# Patient Record
Sex: Female | Born: 1975
Health system: Southern US, Community
[De-identification: ages and names within clinical notes are randomized; demographics above are authoritative.]

## PROBLEM LIST (undated history)

## (undated) HISTORY — PX: CHOLECYSTECTOMY: SHX55

---

## 1998-02-16 ENCOUNTER — Ambulatory Visit (HOSPITAL_COMMUNITY): Admission: RE | Admit: 1998-02-16 | Discharge: 1998-02-16 | Payer: Self-pay | Admitting: Family Medicine

## 2004-04-28 ENCOUNTER — Emergency Department (HOSPITAL_COMMUNITY): Admission: EM | Admit: 2004-04-28 | Discharge: 2004-04-28 | Payer: Self-pay | Admitting: Emergency Medicine

## 2004-05-18 ENCOUNTER — Emergency Department (HOSPITAL_COMMUNITY): Admission: EM | Admit: 2004-05-18 | Discharge: 2004-05-18 | Payer: Self-pay | Admitting: Emergency Medicine

## 2004-09-29 ENCOUNTER — Emergency Department (HOSPITAL_COMMUNITY): Admission: EM | Admit: 2004-09-29 | Discharge: 2004-09-30 | Payer: Self-pay | Admitting: Emergency Medicine

## 2005-03-14 ENCOUNTER — Emergency Department (HOSPITAL_COMMUNITY): Admission: EM | Admit: 2005-03-14 | Discharge: 2005-03-14 | Payer: Self-pay | Admitting: Emergency Medicine

## 2006-03-07 ENCOUNTER — Emergency Department (HOSPITAL_COMMUNITY): Admission: EM | Admit: 2006-03-07 | Discharge: 2006-03-07 | Payer: Self-pay | Admitting: Emergency Medicine

## 2006-06-22 ENCOUNTER — Emergency Department (HOSPITAL_COMMUNITY): Admission: EM | Admit: 2006-06-22 | Discharge: 2006-06-22 | Payer: Self-pay | Admitting: Emergency Medicine

## 2006-07-17 ENCOUNTER — Emergency Department (HOSPITAL_COMMUNITY): Admission: EM | Admit: 2006-07-17 | Discharge: 2006-07-17 | Payer: Self-pay | Admitting: Emergency Medicine

## 2006-08-01 ENCOUNTER — Emergency Department (HOSPITAL_COMMUNITY): Admission: EM | Admit: 2006-08-01 | Discharge: 2006-08-01 | Payer: Self-pay | Admitting: Emergency Medicine

## 2006-08-09 ENCOUNTER — Encounter (INDEPENDENT_AMBULATORY_CARE_PROVIDER_SITE_OTHER): Payer: Self-pay | Admitting: Specialist

## 2006-08-09 ENCOUNTER — Ambulatory Visit (HOSPITAL_COMMUNITY): Admission: RE | Admit: 2006-08-09 | Discharge: 2006-08-09 | Payer: Self-pay | Admitting: Surgery

## 2006-08-19 ENCOUNTER — Emergency Department (HOSPITAL_COMMUNITY): Admission: EM | Admit: 2006-08-19 | Discharge: 2006-08-19 | Payer: Self-pay | Admitting: Emergency Medicine

## 2006-09-07 ENCOUNTER — Emergency Department (HOSPITAL_COMMUNITY): Admission: EM | Admit: 2006-09-07 | Discharge: 2006-09-07 | Payer: Self-pay | Admitting: Emergency Medicine

## 2006-09-16 ENCOUNTER — Emergency Department (HOSPITAL_COMMUNITY): Admission: EM | Admit: 2006-09-16 | Discharge: 2006-09-16 | Payer: Self-pay | Admitting: Emergency Medicine

## 2006-10-17 ENCOUNTER — Emergency Department (HOSPITAL_COMMUNITY): Admission: EM | Admit: 2006-10-17 | Discharge: 2006-10-17 | Payer: Self-pay | Admitting: Emergency Medicine

## 2006-11-01 ENCOUNTER — Emergency Department: Payer: Self-pay | Admitting: Emergency Medicine

## 2007-02-04 ENCOUNTER — Emergency Department (HOSPITAL_COMMUNITY): Admission: EM | Admit: 2007-02-04 | Discharge: 2007-02-04 | Payer: Self-pay | Admitting: Emergency Medicine

## 2008-04-01 ENCOUNTER — Emergency Department (HOSPITAL_BASED_OUTPATIENT_CLINIC_OR_DEPARTMENT_OTHER): Admission: EM | Admit: 2008-04-01 | Discharge: 2008-04-01 | Payer: Self-pay | Admitting: Emergency Medicine

## 2008-04-24 ENCOUNTER — Emergency Department (HOSPITAL_BASED_OUTPATIENT_CLINIC_OR_DEPARTMENT_OTHER): Admission: EM | Admit: 2008-04-24 | Discharge: 2008-04-24 | Payer: Self-pay | Admitting: Emergency Medicine

## 2008-10-19 ENCOUNTER — Emergency Department (HOSPITAL_BASED_OUTPATIENT_CLINIC_OR_DEPARTMENT_OTHER): Admission: EM | Admit: 2008-10-19 | Discharge: 2008-10-19 | Payer: Self-pay | Admitting: Emergency Medicine

## 2008-10-19 ENCOUNTER — Ambulatory Visit: Payer: Self-pay | Admitting: Diagnostic Radiology

## 2008-11-07 ENCOUNTER — Emergency Department (HOSPITAL_BASED_OUTPATIENT_CLINIC_OR_DEPARTMENT_OTHER): Admission: EM | Admit: 2008-11-07 | Discharge: 2008-11-07 | Payer: Self-pay | Admitting: Emergency Medicine

## 2008-12-06 ENCOUNTER — Emergency Department (HOSPITAL_BASED_OUTPATIENT_CLINIC_OR_DEPARTMENT_OTHER): Admission: EM | Admit: 2008-12-06 | Discharge: 2008-12-06 | Payer: Self-pay | Admitting: Emergency Medicine

## 2008-12-28 ENCOUNTER — Emergency Department (HOSPITAL_BASED_OUTPATIENT_CLINIC_OR_DEPARTMENT_OTHER): Admission: EM | Admit: 2008-12-28 | Discharge: 2008-12-28 | Payer: Self-pay | Admitting: Emergency Medicine

## 2009-07-27 ENCOUNTER — Emergency Department (HOSPITAL_BASED_OUTPATIENT_CLINIC_OR_DEPARTMENT_OTHER): Admission: EM | Admit: 2009-07-27 | Discharge: 2009-07-27 | Payer: Self-pay | Admitting: Emergency Medicine

## 2009-07-27 ENCOUNTER — Ambulatory Visit: Payer: Self-pay | Admitting: Diagnostic Radiology

## 2010-01-18 ENCOUNTER — Ambulatory Visit: Payer: Self-pay | Admitting: Diagnostic Radiology

## 2010-01-18 ENCOUNTER — Emergency Department (HOSPITAL_BASED_OUTPATIENT_CLINIC_OR_DEPARTMENT_OTHER): Admission: EM | Admit: 2010-01-18 | Discharge: 2010-01-18 | Payer: Self-pay | Admitting: Emergency Medicine

## 2010-05-03 ENCOUNTER — Emergency Department (HOSPITAL_COMMUNITY): Admission: EM | Admit: 2010-05-03 | Discharge: 2010-05-03 | Payer: Self-pay | Admitting: Emergency Medicine

## 2010-07-09 ENCOUNTER — Ambulatory Visit: Payer: Self-pay | Admitting: Diagnostic Radiology

## 2010-07-09 ENCOUNTER — Emergency Department (HOSPITAL_BASED_OUTPATIENT_CLINIC_OR_DEPARTMENT_OTHER): Admission: EM | Admit: 2010-07-09 | Discharge: 2010-07-09 | Payer: Self-pay | Admitting: Emergency Medicine

## 2011-01-21 NOTE — Op Note (Signed)
[name], [name]               ACCOUNT NO.:  [id number]   MEDICAL RECORD NO.:  [id number]          PATIENT TYPE:  AMB   LOCATION:  SDS                          FACILITY:  [facility]   PHYSICIAN:  Thomas A. Cornett, M.D.DATE OF BIRTH:  24-Jun-1976   DATE OF PROCEDURE:  08/09/2006  DATE OF DISCHARGE:  08/09/2006                               OPERATIVE REPORT   PREOPERATIVE DIAGNOSIS:  Symptomatic cholelithiasis.   POSTOPERATIVE DIAGNOSIS:  Symptomatic cholelithiasis.   PROCEDURE:  Laparoscopic cholecystectomy with interoperative  cholangiogram.   SURGEON:  Thomas A. Cornett, M.D.   ASSISTANT:  Ollen Gross. Vernell Morgans, M.D.   ANESTHESIA:  General endotracheal anesthesia with 20 mL of 0.25%  Sensorcaine plain.   ESTIMATED BLOOD LOSS:  20 mL.   SPECIMEN:  Gallbladder with gallstones to pathology.   INDICATIONS FOR PROCEDURE:  The patient is a 35 year old female who has  developed right upper quadrant pain, nausea, and vomiting.  She was seen  a week ago in the emergency room and was found to have cholelithiasis  and her symptoms fit that of symptomatic cholelithiasis.  I saw her in  the office and recommended laparoscopic cholecystectomy to her for  symptomatic cholelithiasis.  She voiced her understanding and wished to  proceed.   DESCRIPTION OF PROCEDURE:  The patient was brought to the operating room  and placed supine.  After the induction of general anesthesia, the  abdomen is prepped and draped in a sterile fashion.  A 1 cm  supraumbilical incision was made and dissection was carried down to the  midline fascia.  She was very obese and we had some difficulty getting  through her midline but we did.  The preperitoneal space was entered.  We had a hard time getting through it because of the length and the  thickness of her abdominal wall.  I was able to use my finger and push  through the peritoneal lining into the abdominal cavity without any  obstruction.  A pursestring suture  of 0 Vicryl was placed around the  fascial opening and a 12-mm Hassan cannula was placed under direct  vision.  Pneumoperitoneum was then created to 15 mmHg of CO2 and the  laparoscope was placed.  There is no evidence of any injury to her  bowel.  Next, a subxiphoid port was placed using an 11 mm port.  Two 5  mm ports were placed in the a right mid abdomen, both under direct  vision.   The gallbladder is identified and grasped by its dome and pushed toward  the patient's right shoulder.  A second grasper was used and the  infundibulum was grasped and pulled toward the patient's right lower  quadrant.  I used the cautery to score the peritoneal lining over the  gallbladder.  I was able then to dissect out the cystic duct as the only  tubular structure entering the gallbladder.  The cystic artery was lying  over the top of the cystic duct and I dissected it away from the cystic  duct and went ahead and got it clipped.  I then put  a clip on the  gallbladder side of the cystic duct.  A small incision was made and  using the scissors in the cystic duct.  Cholangiogram was then  performed.  I tried to use a Cook cholangiogram catheter but the duct  was quite small and I could not use it.  I then used a Reddick catheter  and placed this in the cystic duct and was able to control it with the  clip.  Interoperative cholangiogram was performed using 1/2 strength  Hypaque.  The cystic duct could be visualized as well as the common bile  duct with free flow of contrast into the duodenum without obstruction,  stone, or stricture.  There is free flow of contrast up the common  hepatic duct into the bifurcation of the right and left hepatic ducts  without evidence of extravasation, stricture, stone or obstruction.   Once the cholangiogram was complete, the catheter was removed. The  cystic duct was triply clipped and then divided.  The cystic artery was  divided at this point since it had already  been clipped.  There was a  posterior branch of the cystic artery that I controlled with a clip and  divided it.  Cautery was used dissect the gallbladder from the  gallbladder fossa with excellent hemostasis.  An EndoCatch bag was  placed and the gallbladder was placed in the EndoCatch bag.  The  gallbladder bed was inspected and found to be hemostatic with no leakage  of bile or any evidence of bleeding.  The clips were on the cystic duct  and cystic artery respectively.  At this point, a small amount of  irrigation was used and suctioned out.  The gallbladder was then removed  through the umbilical port site and passed off the field.  The umbilical  port site was closed the 0 Vicryl under direct vision.  At this point,  the CO2 was allowed to escape after visualizing the 5-mm ports being  removed under direct vision.  Once these were removed and the CO2 was  allowed to escape, the skin incisions were all closed with 4-0 Monocryl.  Steri-Strips and dry dressings were applied.  All final counts of  sponge, needle and instruments were found to be correct at this portion  of the case.  The patient was awakened and taken to recovery in  satisfactory condition.      Thomas A. Cornett, M.D.  Electronically Signed     TAC/MEDQ  D:  08/09/2006  T:  08/10/2006  Job:  [id number]

## 2011-03-12 ENCOUNTER — Encounter: Payer: Self-pay | Admitting: *Deleted

## 2011-03-12 ENCOUNTER — Emergency Department (HOSPITAL_BASED_OUTPATIENT_CLINIC_OR_DEPARTMENT_OTHER)
Admission: EM | Admit: 2011-03-12 | Discharge: 2011-03-12 | Disposition: A | Discharge status: Home / Self Care | Payer: Self-pay | Attending: Emergency Medicine | Admitting: Emergency Medicine

## 2011-03-12 DIAGNOSIS — R51 Headache: Secondary | ICD-10-CM | POA: Insufficient documentation

## 2011-03-12 DIAGNOSIS — J320 Chronic maxillary sinusitis: Secondary | ICD-10-CM | POA: Insufficient documentation

## 2011-03-12 DIAGNOSIS — J3489 Other specified disorders of nose and nasal sinuses: Secondary | ICD-10-CM | POA: Insufficient documentation

## 2011-03-12 MED ORDER — PSEUDOEPHEDRINE HCL 60 MG PO TABS: 60.0000 mg | ORAL_TABLET | Freq: Four times a day (QID) | ORAL | Status: AC | PRN

## 2011-03-12 MED ORDER — AMOXICILLIN 500 MG PO CAPS
ORAL_CAPSULE | ORAL | Status: AC
  Filled 2011-03-12: qty 2

## 2011-03-12 MED ORDER — PSEUDOEPHEDRINE HCL 60 MG PO TABS: 60.0000 mg | ORAL_TABLET | Freq: Four times a day (QID) | ORAL | Status: DC | PRN

## 2011-03-12 MED ORDER — HYDROCODONE-ACETAMINOPHEN 5-325 MG PO TABS
2.0000 | ORAL_TABLET | Freq: Four times a day (QID) | ORAL | Status: AC | PRN
  Administered 2011-03-12: 2 via ORAL
  Filled 2011-03-12: qty 2

## 2011-03-12 MED ORDER — AMOXICILLIN 500 MG PO CAPS: 1000.0000 mg | ORAL_CAPSULE | Freq: Three times a day (TID) | ORAL | Status: AC

## 2011-03-12 MED ORDER — AMOXICILLIN 500 MG PO CAPS
1000.0000 mg | ORAL_CAPSULE | Freq: Three times a day (TID) | ORAL | Status: AC
  Administered 2011-03-12: 1000 mg via ORAL

## 2011-03-12 MED ORDER — HYDROCODONE-ACETAMINOPHEN 5-325 MG PO TABS: 1.0000 | ORAL_TABLET | Freq: Four times a day (QID) | ORAL | Status: AC | PRN

## 2011-03-12 NOTE — ED Provider Notes (Signed)
History     Chief Complaint  Patient presents with  . Nasal Congestion  . Headache   Patient is a 34 y.o. female presenting with sinusitis. The history is provided by the patient.  Sinusitis  This is a recurrent problem. Episode onset: for 3-4 weeks. The problem has been gradually worsening. There has been no fever. The pain is at a severity of 10/10. The pain is severe. The pain has been intermittent since onset. Associated symptoms include congestion, ear pain and sinus pressure. Pertinent negatives include no chills, no sweats, no hoarse voice, no sore throat, no swollen glands, no cough and no shortness of breath. She has tried acetaminophen and other medications (afrin spray for the last 3 days) for the symptoms. The treatment provided no relief.  Pt reports pain and congestion in frontal and maxillary area.  History reviewed. No pertinent past medical history.  Past Surgical History  Procedure Date  . Cholecystectomy     History reviewed. No pertinent family history.  History  Substance Use Topics  . Smoking status: Never Smoker   . Smokeless tobacco: Not on file  . Alcohol Use: No    OB History    Grav Para Term Preterm Abortions TAB SAB Ect Mult Living                  Review of Systems  Constitutional: Negative for fever, chills, diaphoresis, appetite change and fatigue.  HENT: Positive for ear pain, congestion and sinus pressure. Negative for hearing loss, nosebleeds, sore throat, hoarse voice, facial swelling, rhinorrhea, neck pain, neck stiffness and ear discharge.   Eyes: Negative.   Respiratory: Negative.  Negative for cough and shortness of breath.   Cardiovascular: Negative.   Gastrointestinal: Negative.   Genitourinary: Negative.   Musculoskeletal: Negative.   Neurological: Negative.   Hematological: Negative.   Psychiatric/Behavioral: Negative.     Physical Exam  BP 122/95  Pulse 68  Temp(Src) 98.6 F (37 C) (Oral)  Resp 18  Ht 5\' 1"  (1.549 m)   SpO2 100%  LMP 03/06/2011  Physical Exam  Constitutional: She is oriented to person, place, and time. She appears well-developed and well-nourished.       uncomfortable  HENT:  Head: Normocephalic and atraumatic.       Fulness in both tim without redness or effusion Tenderness to percussion over maxillary sinuses frontal sinuses   Eyes: Conjunctivae and EOM are normal. Pupils are equal, round, and reactive to light.  Neck: Normal range of motion. Neck supple. No tracheal deviation present. No thyromegaly present.  Cardiovascular: Normal rate, regular rhythm, normal heart sounds and intact distal pulses.   Pulmonary/Chest: Effort normal and breath sounds normal. No stridor. She has no wheezes. She has no rales. She exhibits no tenderness.  Abdominal: Soft. Bowel sounds are normal. She exhibits no distension and no mass. There is no tenderness. There is no rebound and no guarding.  Neurological: She is alert and oriented to person, place, and time.  Skin: Skin is warm and dry.  Psychiatric: She has a normal mood and affect. Her behavior is normal. Judgment and thought content normal.    ED Course  Procedures  MDM Pt with 3-4 weeks of congestion, pain.  Concern for chronic sinusitis.  Will treat pain with vicodin, and treat congestion with sudafed/amoxcillin.  [facility], instructed to f/u with them in about a week.      Olivia Mackie, MD 03/12/11 2124

## 2011-03-12 NOTE — ED Notes (Signed)
Pt with sinus pressure and nasal congestion x 3 weeks reports HA intromittently throughout that time HA became worse this am  HA is relieved somewhat with OTC meds

## 2011-04-10 ENCOUNTER — Encounter (HOSPITAL_BASED_OUTPATIENT_CLINIC_OR_DEPARTMENT_OTHER): Payer: Self-pay | Admitting: *Deleted

## 2011-04-10 ENCOUNTER — Emergency Department (HOSPITAL_BASED_OUTPATIENT_CLINIC_OR_DEPARTMENT_OTHER)
Admission: EM | Admit: 2011-04-10 | Discharge: 2011-04-10 | Disposition: A | Discharge status: Home / Self Care | Payer: Self-pay | Attending: Emergency Medicine | Admitting: Emergency Medicine

## 2011-04-10 ENCOUNTER — Emergency Department (INDEPENDENT_AMBULATORY_CARE_PROVIDER_SITE_OTHER): Payer: Self-pay

## 2011-04-10 DIAGNOSIS — X58XXXA Exposure to other specified factors, initial encounter: Secondary | ICD-10-CM

## 2011-04-10 DIAGNOSIS — S6990XA Unspecified injury of unspecified wrist, hand and finger(s), initial encounter: Secondary | ICD-10-CM

## 2011-04-10 DIAGNOSIS — S6980XA Other specified injuries of unspecified wrist, hand and finger(s), initial encounter: Secondary | ICD-10-CM

## 2011-04-10 DIAGNOSIS — S63659A Sprain of metacarpophalangeal joint of unspecified finger, initial encounter: Secondary | ICD-10-CM | POA: Insufficient documentation

## 2011-04-10 MED ORDER — OXYCODONE-ACETAMINOPHEN 5-325 MG PO TABS: 2.0000 | ORAL_TABLET | ORAL | Status: AC | PRN

## 2011-04-10 MED ORDER — OXYCODONE-ACETAMINOPHEN 5-325 MG PO TABS
ORAL_TABLET | ORAL | Status: AC
  Filled 2011-04-10: qty 1

## 2011-04-10 MED ORDER — OXYCODONE-ACETAMINOPHEN 5-325 MG PO TABS
1.0000 | ORAL_TABLET | Freq: Once | ORAL | Status: AC
  Administered 2011-04-10: 1 via ORAL

## 2011-04-10 MED ORDER — OXYCODONE-ACETAMINOPHEN 5-325 MG PO TABS
1.0000 | ORAL_TABLET | Freq: Once | ORAL | Status: AC
  Administered 2011-04-10: 1 via ORAL
  Filled 2011-04-10: qty 1

## 2011-04-10 NOTE — ED Notes (Signed)
Pt tripped this Pm and landed on her right hand pt with pain to 1st sand 2ed digits pt with pain and swelling

## 2011-04-10 NOTE — ED Provider Notes (Signed)
History     CSN: [id number] Arrival date & time: 04/10/2011  7:45 PM  Chief Complaint  Patient presents with  . Finger Injury   Patient is a 35 y.o. female presenting with hand pain.  Hand Pain This is a new problem. The current episode started less than 1 hour ago. The problem occurs constantly. The problem has not changed since onset. patient states that she fell and hurt her index and middle fingers on her left hand. Pain and swelling. No numbness, but limited movement due to pain. No other injuries.   History reviewed. No pertinent past medical history.  Past Surgical History  Procedure Date  . Cholecystectomy     History reviewed. No pertinent family history.  History  Substance Use Topics  . Smoking status: Never Smoker   . Smokeless tobacco: Not on file  . Alcohol Use: No    OB History    Grav Para Term Preterm Abortions TAB SAB Ect Mult Living                  Review of Systems  HENT: Negative for neck pain.   Musculoskeletal: Positive for joint swelling.  Neurological: Negative for weakness and numbness.    Physical Exam  BP 126/86  Pulse 97  Temp(Src) 97.5 F (36.4 C) (Oral)  Resp 18  SpO2 98%  LMP 03/06/2011  Physical Exam  Vitals reviewed. Constitutional: She appears well-developed.  HENT:  Head: Normocephalic.  Eyes: Pupils are equal, round, and reactive to light.  Neck: Normal range of motion.  Musculoskeletal:       Swelling and tenderness over proximal phalynx of right index and middle fingers. Some erethema. Skin intact. Decreased ROM due to pain.   Neurological: She is alert.  Skin: Skin is warm and dry.    ED Course  Procedures  No results found for this or any previous visit. Dg Finger Index Right  04/10/2011  *RADIOLOGY REPORT*  Clinical Data: Injury to right index and middle fingers  RIGHT INDEX FINGER 2+V  Comparison: None.  Findings: No fracture or dislocation is seen.  The joint spaces are preserved.  Possible mild soft tissue  swelling proximally.  IMPRESSION: No fracture or dislocation is seen.  Original Report Authenticated By: Charline Bills, M.D.   Dg Finger Middle Right  04/10/2011  *RADIOLOGY REPORT*  Clinical Data: Injury to right index and middle fingers.  RIGHT MIDDLE FINGER 2+V  Comparison: None.  Findings: No fracture or dislocation is seen.  The joint spaces are preserved.  Mild soft tissue swelling along the proximal phalanx.  IMPRESSION: No fracture or dislocation is seen.  Mild soft tissue swelling.  Original Report Authenticated By: Charline Bills, M.D.     MDM Finger injury xray negative. Buddy tap and follow up as needed.       Juliet Rude. Rubin Payor, MD 04/10/11 2228

## 2011-04-17 ENCOUNTER — Emergency Department (HOSPITAL_BASED_OUTPATIENT_CLINIC_OR_DEPARTMENT_OTHER): Payer: Self-pay

## 2011-04-17 ENCOUNTER — Emergency Department (INDEPENDENT_AMBULATORY_CARE_PROVIDER_SITE_OTHER): Payer: Self-pay

## 2011-04-17 ENCOUNTER — Emergency Department (HOSPITAL_BASED_OUTPATIENT_CLINIC_OR_DEPARTMENT_OTHER)
Admission: EM | Admit: 2011-04-17 | Discharge: 2011-04-18 | Disposition: A | Discharge status: Home / Self Care | Payer: Self-pay | Attending: Emergency Medicine | Admitting: Emergency Medicine

## 2011-04-17 ENCOUNTER — Encounter (HOSPITAL_BASED_OUTPATIENT_CLINIC_OR_DEPARTMENT_OTHER): Payer: Self-pay | Admitting: *Deleted

## 2011-04-17 DIAGNOSIS — M7730 Calcaneal spur, unspecified foot: Secondary | ICD-10-CM

## 2011-04-17 DIAGNOSIS — S9030XA Contusion of unspecified foot, initial encounter: Secondary | ICD-10-CM

## 2011-04-17 DIAGNOSIS — X500XXA Overexertion from strenuous movement or load, initial encounter: Secondary | ICD-10-CM | POA: Insufficient documentation

## 2011-04-17 DIAGNOSIS — M25579 Pain in unspecified ankle and joints of unspecified foot: Secondary | ICD-10-CM | POA: Insufficient documentation

## 2011-04-17 DIAGNOSIS — Y92009 Unspecified place in unspecified non-institutional (private) residence as the place of occurrence of the external cause: Secondary | ICD-10-CM | POA: Insufficient documentation

## 2011-04-17 DIAGNOSIS — M7989 Other specified soft tissue disorders: Secondary | ICD-10-CM

## 2011-04-17 MED ORDER — HYDROCODONE-ACETAMINOPHEN 5-325 MG PO TABS
2.0000 | ORAL_TABLET | Freq: Once | ORAL | Status: AC
  Administered 2011-04-17: 2 via ORAL
  Filled 2011-04-17: qty 2

## 2011-04-17 MED ORDER — HYDROCODONE-ACETAMINOPHEN 5-325 MG PO TABS
ORAL_TABLET | ORAL | Status: AC
  Filled 2011-04-17: qty 1

## 2011-04-17 NOTE — ED Provider Notes (Signed)
History     CSN: [id number] Arrival date & time: 04/17/2011 10:31 PM  Chief Complaint  Patient presents with  . Foot Injury   Patient is a 35 y.o. female presenting with foot injury. The history is provided by the patient.  Foot Injury  The incident occurred 3 to 5 hours ago. The incident occurred at home. The injury mechanism was a fall. The pain is present in the right foot. The quality of the pain is described as aching, sharp and throbbing. The pain is at a severity of 5/10. The pain is moderate. The pain has been constant since onset. Pertinent negatives include no numbness. She reports no foreign bodies present. The symptoms are aggravated by bearing weight and palpation. She has tried ice for the symptoms. The treatment provided no relief.   patient reports she stepped in a hole and injured right foot patient complains of swelling to the mid upper right foot there is bruising.  History reviewed. No pertinent past medical history.  Past Surgical History  Procedure Date  . Cholecystectomy     History reviewed. No pertinent family history.  History  Substance Use Topics  . Smoking status: Never Smoker   . Smokeless tobacco: Not on file  . Alcohol Use: No    OB History    Grav Para Term Preterm Abortions TAB SAB Ect Mult Living                  Review of Systems  Musculoskeletal: Positive for myalgias and joint swelling.  Neurological: Negative for numbness.  All other systems reviewed and are negative.    Physical Exam  BP 132/80  Pulse 80  Temp(Src) 98.2 F (36.8 C) (Oral)  Resp 20  Ht 5' 1.5" (1.562 m)  Wt 235 lb (106.595 kg)  BMI 43.68 kg/m2  SpO2 96%  LMP 04/06/2011  Physical Exam  Nursing note and vitals reviewed. Constitutional: She appears well-developed and well-nourished.  Eyes: Pupils are equal, round, and reactive to light.  Cardiovascular: Normal rate.   Pulmonary/Chest: Effort normal.  Musculoskeletal: She exhibits edema and tenderness.      Bruising  mid upper right foot. Decreased range of motion ankle is nontender neurovascular and neurosensory are intact  Neurological: She is alert.  Skin: Skin is warm.  Psychiatric: She has a normal mood and affect.    ED Course  Procedures  MDM  Ace and post op shoe,   No results found for this or any previous visit. Dg Finger Index Right  04/10/2011  *RADIOLOGY REPORT*  Clinical Data: Injury to right index and middle fingers  RIGHT INDEX FINGER 2+V  Comparison: None.  Findings: No fracture or dislocation is seen.  The joint spaces are preserved.  Possible mild soft tissue swelling proximally.  IMPRESSION: No fracture or dislocation is seen.  Original Report Authenticated By: Charline Bills, M.D.   Dg Finger Middle Right  04/10/2011  *RADIOLOGY REPORT*  Clinical Data: Injury to right index and middle fingers.  RIGHT MIDDLE FINGER 2+V  Comparison: None.  Findings: No fracture or dislocation is seen.  The joint spaces are preserved.  Mild soft tissue swelling along the proximal phalanx.  IMPRESSION: No fracture or dislocation is seen.  Mild soft tissue swelling.  Original Report Authenticated By: Charline Bills, M.D.   Dg Foot Complete Right  04/17/2011  *RADIOLOGY REPORT*  Clinical Data: The patient stepped in a hole and injured right foot and ankle.  RIGHT FOOT COMPLETE - 3+ VIEW  Comparison:  07/09/2010  Findings: Plantar and Achilles calcaneal spurs noted.  Lateral view was obtained with the foot markedly plantar flexed.  Dorsal midfoot spurring noted with mild dorsal soft tissue swelling along the foot.  No fracture identified.  No malalignment at the Lisfranc joint noted.  IMPRESSION:  1.  Mild dorsal soft tissue swelling. 2.  Stable dorsal midfoot degenerative spurring. 3.  Stable plantar calcaneal spur.  Original Report Authenticated By: Dellia Cloud, M.D.       Langston Masker, [place] 04/17/11 2357  Medical screening examination/treatment/procedure(s) were performed by  non-physician practitioner and as supervising physician I was immediately available for consultation/collaboration.  Olivia Mackie, MD 04/18/11 [id number]

## 2011-04-17 NOTE — ED Notes (Signed)
Pt states she stepped in a hole and injured her right foot and ankle

## 2011-04-18 MED ORDER — HYDROCODONE-ACETAMINOPHEN 5-325 MG PO TABS: 2.0000 | ORAL_TABLET | ORAL | Status: AC | PRN

## 2011-05-01 ENCOUNTER — Emergency Department (HOSPITAL_COMMUNITY): Payer: Self-pay

## 2011-05-01 ENCOUNTER — Emergency Department (HOSPITAL_COMMUNITY)
Admission: EM | Admit: 2011-05-01 | Discharge: 2011-05-01 | Disposition: A | Discharge status: Home / Self Care | Payer: Self-pay | Attending: Emergency Medicine | Admitting: Emergency Medicine

## 2011-05-01 DIAGNOSIS — S139XXA Sprain of joints and ligaments of unspecified parts of neck, initial encounter: Secondary | ICD-10-CM | POA: Insufficient documentation

## 2011-05-01 DIAGNOSIS — M542 Cervicalgia: Secondary | ICD-10-CM | POA: Insufficient documentation

## 2011-05-01 DIAGNOSIS — M549 Dorsalgia, unspecified: Secondary | ICD-10-CM | POA: Insufficient documentation

## 2011-05-01 DIAGNOSIS — W1789XA Other fall from one level to another, initial encounter: Secondary | ICD-10-CM | POA: Insufficient documentation

## 2011-05-01 DIAGNOSIS — M62838 Other muscle spasm: Secondary | ICD-10-CM | POA: Insufficient documentation

## 2011-05-20 ENCOUNTER — Emergency Department (INDEPENDENT_AMBULATORY_CARE_PROVIDER_SITE_OTHER): Payer: Self-pay

## 2011-05-20 ENCOUNTER — Encounter (HOSPITAL_BASED_OUTPATIENT_CLINIC_OR_DEPARTMENT_OTHER): Payer: Self-pay | Admitting: *Deleted

## 2011-05-20 ENCOUNTER — Emergency Department (HOSPITAL_BASED_OUTPATIENT_CLINIC_OR_DEPARTMENT_OTHER)
Admission: EM | Admit: 2011-05-20 | Discharge: 2011-05-20 | Disposition: A | Discharge status: Home / Self Care | Payer: Self-pay | Attending: Emergency Medicine | Admitting: Emergency Medicine

## 2011-05-20 DIAGNOSIS — X500XXA Overexertion from strenuous movement or load, initial encounter: Secondary | ICD-10-CM | POA: Insufficient documentation

## 2011-05-20 DIAGNOSIS — R109 Unspecified abdominal pain: Secondary | ICD-10-CM

## 2011-05-20 DIAGNOSIS — M549 Dorsalgia, unspecified: Secondary | ICD-10-CM

## 2011-05-20 DIAGNOSIS — S39012A Strain of muscle, fascia and tendon of lower back, initial encounter: Secondary | ICD-10-CM

## 2011-05-20 DIAGNOSIS — S339XXA Sprain of unspecified parts of lumbar spine and pelvis, initial encounter: Secondary | ICD-10-CM | POA: Insufficient documentation

## 2011-05-20 LAB — URINALYSIS, ROUTINE W REFLEX MICROSCOPIC
Bilirubin Urine: NEGATIVE
Ketones, ur: NEGATIVE mg/dL
Nitrite: NEGATIVE
Protein, ur: NEGATIVE mg/dL
Specific Gravity, Urine: 1.013 (ref 1.005–1.030)
Urobilinogen, UA: 1 mg/dL (ref 0.0–1.0)

## 2011-05-20 LAB — URINE MICROSCOPIC-ADD ON

## 2011-05-20 MED ORDER — NAPROXEN 500 MG PO TABS: 500.0000 mg | ORAL_TABLET | Freq: Two times a day (BID) | ORAL | Status: DC

## 2011-05-20 MED ORDER — OXYCODONE-ACETAMINOPHEN 5-325 MG PO TABS: 2.0000 | ORAL_TABLET | ORAL | Status: AC | PRN

## 2011-05-20 MED ORDER — CYCLOBENZAPRINE HCL 10 MG PO TABS: 10.0000 mg | ORAL_TABLET | Freq: Two times a day (BID) | ORAL | Status: AC | PRN

## 2011-05-20 MED ORDER — KETOROLAC TROMETHAMINE 30 MG/ML IJ SOLN
30.0000 mg | Freq: Once | INTRAMUSCULAR | Status: AC
  Administered 2011-05-20: 30 mg via INTRAVENOUS
  Filled 2011-05-20: qty 1

## 2011-05-20 MED ORDER — ONDANSETRON HCL 4 MG/2ML IJ SOLN
4.0000 mg | Freq: Once | INTRAMUSCULAR | Status: AC
  Administered 2011-05-20: 4 mg via INTRAVENOUS
  Filled 2011-05-20: qty 2

## 2011-05-20 MED ORDER — SODIUM CHLORIDE 0.9 % IV BOLUS (SEPSIS)
1000.0000 mL | Freq: Once | INTRAVENOUS | Status: AC
  Administered 2011-05-20: 1000 mL via INTRAVENOUS

## 2011-05-20 MED ORDER — HYDROMORPHONE HCL 1 MG/ML IJ SOLN
1.0000 mg | Freq: Once | INTRAMUSCULAR | Status: AC
  Administered 2011-05-20: 1 mg via INTRAVENOUS
  Filled 2011-05-20: qty 1

## 2011-05-20 NOTE — ED Notes (Signed)
Pt reports h/o uti and sts "this feels the same except a little more painful". Pt c/o right flank pain radiating to right groin.

## 2011-05-20 NOTE — ED Provider Notes (Signed)
History     CSN: [id number] Arrival date & time: 05/20/2011  4:39 PM   Chief Complaint  Patient presents with  . Back Pain     (Include location/radiation/quality/duration/timing/severity/associated sxs/prior treatment) Patient is a 35 y.o. female presenting with back pain. The history is provided by the patient. No language interpreter was used.  Back Pain  This is a new problem. The current episode started 12 to 24 hours ago. The problem occurs constantly. The problem has been gradually worsening. The pain is associated with no known injury. The pain is present in the lumbar spine. The quality of the pain is described as shooting and aching. Radiates to: R abdomen and groin. The pain is moderate. The symptoms are aggravated by bending, certain positions and twisting. The pain is the same all the time. Associated symptoms include abdominal pain and dysuria. Pertinent negatives include no chest pain, no fever, no numbness, no headaches, no abdominal swelling, no bowel incontinence, no perianal numbness, no bladder incontinence, no pelvic pain, no leg pain, no paresthesias, no tingling and no weakness. She has tried nothing for the symptoms. Risk factors include obesity.     History reviewed. No pertinent past medical history.   Past Surgical History  Procedure Date  . Cholecystectomy     History reviewed. No pertinent family history.  History  Substance Use Topics  . Smoking status: Never Smoker   . Smokeless tobacco: Not on file  . Alcohol Use: No    OB History    Grav Para Term Preterm Abortions TAB SAB Ect Mult Living                  Review of Systems  Constitutional: Negative for fever, activity change, appetite change and fatigue.  HENT: Negative for congestion, sore throat, rhinorrhea, neck pain and neck stiffness.   Respiratory: Negative for cough, chest tightness and shortness of breath.   Cardiovascular: Negative for chest pain and palpitations.    Gastrointestinal: Positive for abdominal pain. Negative for nausea, vomiting, diarrhea, constipation and bowel incontinence.  Genitourinary: Positive for dysuria. Negative for bladder incontinence, urgency, frequency, flank pain, vaginal bleeding, vaginal discharge, difficulty urinating and pelvic pain.  Musculoskeletal: Positive for back pain. Negative for gait problem.  Neurological: Negative for dizziness, tingling, weakness, light-headedness, numbness, headaches and paresthesias.  All other systems reviewed and are negative.    Allergies  Penicillins  Home Medications   Current Outpatient Rx  Name Route Sig Dispense Refill  . IBUPROFEN 200 MG PO TABS Oral Take 400 mg by mouth once as needed. For pain    . CYCLOBENZAPRINE HCL 10 MG PO TABS Oral Take 1 tablet (10 mg total) by mouth 2 (two) times daily as needed for muscle spasms. 20 tablet 0  . NAPROXEN 500 MG PO TABS Oral Take 1 tablet (500 mg total) by mouth 2 (two) times daily. 30 tablet 0  . OXYCODONE-ACETAMINOPHEN 5-325 MG PO TABS Oral Take 2 tablets by mouth every 4 (four) hours as needed for pain. 15 tablet 0    Physical Exam    BP 116/84  Pulse 82  Temp(Src) 98.3 F (36.8 C) (Oral)  Resp 22  SpO2 99%  Physical Exam  Nursing note and vitals reviewed. Constitutional: She is oriented to person, place, and time. She appears well-developed and well-nourished. No distress.  HENT:  Head: Normocephalic and atraumatic.  Mouth/Throat: Oropharynx is clear and moist. No oropharyngeal exudate.  Eyes: Conjunctivae and EOM are normal. Pupils are equal, round, and  reactive to light.  Neck: Normal range of motion. Neck supple.  Cardiovascular: Normal rate, regular rhythm, normal heart sounds and intact distal pulses.  Exam reveals no gallop and no friction rub.   No murmur heard. Pulmonary/Chest: Effort normal and breath sounds normal. No respiratory distress.  Abdominal: Soft. Bowel sounds are normal. She exhibits no distension.  There is no tenderness.  Musculoskeletal: Normal range of motion.       Lumbar back: She exhibits tenderness, pain and spasm. She exhibits no bony tenderness, no swelling and no deformity.  Neurological: She is alert and oriented to person, place, and time. She has normal strength and normal reflexes. No cranial nerve deficit or sensory deficit.  Skin: Skin is warm and dry. No rash noted.    ED Course  Procedures  Results for orders placed during the hospital encounter of 05/20/11  URINALYSIS, ROUTINE W REFLEX MICROSCOPIC      Component Value Range   Color, Urine YELLOW  YELLOW    Appearance CLEAR  CLEAR    Specific Gravity, Urine 1.013  1.005 - 1.030    pH 6.5  5.0 - 8.0    Glucose, UA NEGATIVE  NEGATIVE (mg/dL)   Hgb urine dipstick TRACE (*) NEGATIVE    Bilirubin Urine NEGATIVE  NEGATIVE    Ketones, ur NEGATIVE  NEGATIVE (mg/dL)   Protein, ur NEGATIVE  NEGATIVE (mg/dL)   Urobilinogen, UA 1.0  0.0 - 1.0 (mg/dL)   Nitrite NEGATIVE  NEGATIVE    Leukocytes, UA NEGATIVE  NEGATIVE   PREGNANCY, URINE      Component Value Range   Preg Test, Ur NEGATIVE    URINE MICROSCOPIC-ADD ON      Component Value Range   Squamous Epithelial / LPF RARE  RARE    WBC, UA 0-2  <3 (WBC/hpf)   RBC / HPF 0-2  <3 (RBC/hpf)   Bacteria, UA FEW (*) RARE    Dg Cervical Spine Complete  05/01/2011  *RADIOLOGY REPORT*  Clinical Data: Fall 1 day ago with neck pain most prominent at the C6-7 level.  CERVICAL SPINE - COMPLETE 4+ VIEW  Comparison: None.  Findings: No precervical soft tissue widening is present. C1 through the cervical thoracic junction is visualized in its entirety.  Normal alignment. The dens is intact and well situated between the lateral masses.  No fracture or dislocation.  Neural foramina are widely patent. Extensive dental caries partly visualized.  IMPRESSION: No acute abnormality.  Original Report Authenticated By: Harrel Lemon, M.D.   CT stone study was performed was negative for  stone. The patient's symptoms may be due to a passed stone.   1. Lumbosacral strain      MDM The patient has signs and symptoms suggesting a lumbosacral strain. She expresses flank pain with radiation to the abdomen and groin therefore a stone study was performed. She had small blood in her urine with no evidence of infection. CT stone study was negative for stone. She may still have symptoms from a passed stone. I'm not concerned about what the cause of back pains as cauda equina as she has none of these specific symptoms. Facial be discharged home with anti-inflammatory medication, pain medication, muscle relaxants. She was instructed to followup with her primary care physician next week is provided instructions for which to return. I encouraged ice and heat.       Dayton Bailiff, MD 05/20/11 [id number]

## 2011-05-20 NOTE — ED Notes (Signed)
Dysuria and lower back pain

## 2011-07-17 ENCOUNTER — Emergency Department (INDEPENDENT_AMBULATORY_CARE_PROVIDER_SITE_OTHER): Payer: Self-pay

## 2011-07-17 ENCOUNTER — Emergency Department (HOSPITAL_BASED_OUTPATIENT_CLINIC_OR_DEPARTMENT_OTHER)
Admission: EM | Admit: 2011-07-17 | Discharge: 2011-07-17 | Disposition: A | Discharge status: Home / Self Care | Payer: Self-pay | Attending: Emergency Medicine | Admitting: Emergency Medicine

## 2011-07-17 ENCOUNTER — Encounter (HOSPITAL_BASED_OUTPATIENT_CLINIC_OR_DEPARTMENT_OTHER): Payer: Self-pay | Admitting: *Deleted

## 2011-07-17 DIAGNOSIS — R0789 Other chest pain: Secondary | ICD-10-CM

## 2011-07-17 DIAGNOSIS — R059 Cough, unspecified: Secondary | ICD-10-CM

## 2011-07-17 DIAGNOSIS — J40 Bronchitis, not specified as acute or chronic: Secondary | ICD-10-CM | POA: Insufficient documentation

## 2011-07-17 DIAGNOSIS — R05 Cough: Secondary | ICD-10-CM | POA: Insufficient documentation

## 2011-07-17 MED ORDER — AZITHROMYCIN 250 MG PO TABS: 250.0000 mg | ORAL_TABLET | Freq: Every day | ORAL | Status: AC

## 2011-07-17 MED ORDER — HYDROCOD POLST-CHLORPHEN POLST 10-8 MG/5ML PO LQCR: 5.0000 mL | Freq: Two times a day (BID) | ORAL | Status: DC | PRN

## 2011-07-17 MED ORDER — HYDROCOD POLST-CHLORPHEN POLST 10-8 MG/5ML PO LQCR
5.0000 mL | Freq: Once | ORAL | Status: AC
  Administered 2011-07-17: 5 mL via ORAL
  Filled 2011-07-17: qty 5

## 2011-07-17 NOTE — ED Provider Notes (Signed)
History     CSN: [id number] Arrival date & time: 07/17/2011  5:50 PM   First MD Initiated Contact with Patient 07/17/11 1849      Chief Complaint  Patient presents with  . Cough    (Consider location/radiation/quality/duration/timing/severity/associated sxs/prior treatment) HPI Comments: Pt state that she has been coughing so hard that she is having left lower rib pain and she noticed some pink sputum today with coughing:pt states that she has had chills but is unsure if she has had fever  Patient is a 35 y.o. female presenting with cough. The history is provided by the patient. No language interpreter was used.  Cough This is a new problem. The current episode started more than 2 days ago. The problem occurs hourly. The problem has not changed since onset.The cough is productive of blood-tinged sputum. There has been no fever. Pertinent negatives include no rhinorrhea, no sore throat, no shortness of breath and no wheezing. Smoker: pt state that she hasn't smoked in 4 months.    History reviewed. No pertinent past medical history.  Past Surgical History  Procedure Date  . Cholecystectomy     History reviewed. No pertinent family history.  History  Substance Use Topics  . Smoking status: Never Smoker   . Smokeless tobacco: Not on file  . Alcohol Use: No    OB History    Grav Para Term Preterm Abortions TAB SAB Ect Mult Living                  Review of Systems  HENT: Negative for sore throat and rhinorrhea.   Respiratory: Positive for cough. Negative for shortness of breath and wheezing.   All other systems reviewed and are negative.    Allergies  Kiwi extract; Other; and Penicillins  Home Medications   Current Outpatient Rx  Name Route Sig Dispense Refill  . IBUPROFEN 200 MG PO TABS Oral Take 400 mg by mouth every 6 (six) hours as needed. For pain    . NAPROXEN 500 MG PO TABS Oral Take 1 tablet (500 mg total) by mouth 2 (two) times daily. 30 tablet 0     BP 122/76  Pulse 91  Temp(Src) 98.2 F (36.8 C) (Oral)  Resp 18  Ht 5\' 1"  (1.549 m)  Wt 240 lb (108.863 kg)  BMI 45.35 kg/m2  SpO2 100%  LMP 07/07/2011  Physical Exam  Nursing note and vitals reviewed. Constitutional: She is oriented to person, place, and time. She appears well-developed and well-nourished.  HENT:  Head: Normocephalic and atraumatic.  Right Ear: External ear normal.  Left Ear: External ear normal.  Mouth/Throat: Oropharynx is clear and moist.  Eyes: Pupils are equal, round, and reactive to light.  Cardiovascular: Normal rate and regular rhythm.   Pulmonary/Chest: Effort normal and breath sounds normal.       Pt tender in the left lower lateral ribs  Musculoskeletal: Normal range of motion.  Neurological: She is alert and oriented to person, place, and time.  Skin: Skin is warm and dry.  Psychiatric: She has a normal mood and affect.    ED Course  Procedures (including critical care time)  Labs Reviewed - No data to display Dg Chest 2 View  07/17/2011  *RADIOLOGY REPORT*  Clinical Data: Hemoptysis.  Cough.  Rib pain.  CHEST - 2 VIEW  Comparison: 04/01/2008  Findings: There is peribronchial thickening consistent with bronchitis.  Heart size and vascularity are normal.  No infiltrates or effusions.  No osseous abnormality.  IMPRESSION: Bronchitic changes.  Original Report Authenticated By: Gwynn Burly, M.D.     1. Bronchitis       MDM  Will place on antibiotics because of symptoms:no sign of pneumonia on pneumothorax noted        Teressa Lower, NP 07/17/11 1934

## 2011-07-17 NOTE — ED Provider Notes (Signed)
Medical screening examination/treatment/procedure(s) were performed by non-physician practitioner and as supervising physician I was immediately available for consultation/collaboration.  Harjit Leider M Harlow Basley, MD 07/17/11 2343 

## 2011-07-17 NOTE — ED Notes (Signed)
Pt states she has had a cough for a few days and has started coughing up a little" clr foamy blood-tinged sputum"

## 2011-08-15 ENCOUNTER — Emergency Department (INDEPENDENT_AMBULATORY_CARE_PROVIDER_SITE_OTHER): Payer: Self-pay

## 2011-08-15 ENCOUNTER — Encounter (HOSPITAL_BASED_OUTPATIENT_CLINIC_OR_DEPARTMENT_OTHER): Payer: Self-pay | Admitting: *Deleted

## 2011-08-15 ENCOUNTER — Emergency Department (HOSPITAL_BASED_OUTPATIENT_CLINIC_OR_DEPARTMENT_OTHER)
Admission: EM | Admit: 2011-08-15 | Discharge: 2011-08-15 | Disposition: A | Discharge status: Home / Self Care | Payer: Self-pay | Attending: Emergency Medicine | Admitting: Emergency Medicine

## 2011-08-15 DIAGNOSIS — R109 Unspecified abdominal pain: Secondary | ICD-10-CM | POA: Insufficient documentation

## 2011-08-15 DIAGNOSIS — M549 Dorsalgia, unspecified: Secondary | ICD-10-CM | POA: Insufficient documentation

## 2011-08-15 DIAGNOSIS — R300 Dysuria: Secondary | ICD-10-CM | POA: Insufficient documentation

## 2011-08-15 LAB — PREGNANCY, URINE: Preg Test, Ur: NEGATIVE

## 2011-08-15 LAB — URINE MICROSCOPIC-ADD ON

## 2011-08-15 LAB — URINALYSIS, ROUTINE W REFLEX MICROSCOPIC
Glucose, UA: NEGATIVE mg/dL
Leukocytes, UA: NEGATIVE
Nitrite: NEGATIVE
Specific Gravity, Urine: 1.015 (ref 1.005–1.030)
pH: 5.5 (ref 5.0–8.0)

## 2011-08-15 MED ORDER — MORPHINE SULFATE 4 MG/ML IJ SOLN
4.0000 mg | Freq: Once | INTRAMUSCULAR | Status: AC
  Administered 2011-08-15: 4 mg via INTRAMUSCULAR
  Filled 2011-08-15: qty 1

## 2011-08-15 MED ORDER — KETOROLAC TROMETHAMINE 60 MG/2ML IM SOLN
60.0000 mg | Freq: Once | INTRAMUSCULAR | Status: AC
  Administered 2011-08-15: 60 mg via INTRAMUSCULAR
  Filled 2011-08-15: qty 2

## 2011-08-15 MED ORDER — ONDANSETRON 4 MG PO TBDP
4.0000 mg | ORAL_TABLET | Freq: Once | ORAL | Status: AC
  Administered 2011-08-15: 4 mg via ORAL
  Filled 2011-08-15: qty 1

## 2011-08-15 MED ORDER — TRAMADOL HCL 50 MG PO TABS: 50.0000 mg | ORAL_TABLET | Freq: Four times a day (QID) | ORAL | Status: AC | PRN

## 2011-08-15 NOTE — ED Provider Notes (Signed)
History     CSN: [id number] Arrival date & time: 08/15/2011  8:13 PM   First MD Initiated Contact with Patient 08/15/11 2024      Chief Complaint  Patient presents with  . Back Pain    (Consider location/radiation/quality/duration/timing/severity/associated sxs/prior treatment) HPI Comments: Pt states that she had a history of a stone a couple of months ago and this feels similar  Patient is a 35 y.o. female presenting with back pain. The history is provided by the patient. No language interpreter was used.  Back Pain  This is a new problem. The current episode started 3 to 5 hours ago. The problem has not changed since onset.The pain is associated with no known injury. The pain is present in the lumbar spine. The quality of the pain is described as aching. The pain is the same all the time. Associated symptoms include dysuria. Pertinent negatives include no abdominal pain, no bladder incontinence, no tingling and no weakness.    History reviewed. No pertinent past medical history.  Past Surgical History  Procedure Date  . Cholecystectomy     No family history on file.  History  Substance Use Topics  . Smoking status: Never Smoker   . Smokeless tobacco: Not on file  . Alcohol Use: No    OB History    Grav Para Term Preterm Abortions TAB SAB Ect Mult Living                  Review of Systems  Gastrointestinal: Negative for abdominal pain.  Genitourinary: Positive for dysuria. Negative for bladder incontinence.  Musculoskeletal: Positive for back pain.  Neurological: Negative for tingling and weakness.  All other systems reviewed and are negative.    Allergies  Kiwi extract; Other; and Penicillins  Home Medications   Current Outpatient Rx  Name Route Sig Dispense Refill  . HYDROCOD POLST-CHLORPHEN POLST 10-8 MG/5ML PO LQCR Oral Take 5 mLs by mouth every 12 (twelve) hours as needed. 140 mL 0  . IBUPROFEN 200 MG PO TABS Oral Take 400 mg by mouth every 6 (six)  hours as needed. For pain    . NAPROXEN 500 MG PO TABS Oral Take 1 tablet (500 mg total) by mouth 2 (two) times daily. 30 tablet 0    BP 123/90  Pulse 80  Temp(Src) 98.3 F (36.8 C) (Oral)  Resp 24  SpO2 100%  LMP 07/07/2011  Physical Exam  Nursing note and vitals reviewed. Constitutional: She is oriented to person, place, and time. She appears well-developed and well-nourished.  HENT:  Head: Normocephalic and atraumatic.  Cardiovascular: Normal rate and regular rhythm.   Pulmonary/Chest: Effort normal and breath sounds normal.  Abdominal: Soft. Bowel sounds are normal. There is CVA tenderness.  Musculoskeletal: Normal range of motion.  Neurological: She is alert and oriented to person, place, and time.  Skin: Skin is warm and dry.  Psychiatric: She has a normal mood and affect.    ED Course  Procedures (including critical care time)  Labs Reviewed  URINALYSIS, ROUTINE W REFLEX MICROSCOPIC - Abnormal; Notable for the following:    Hgb urine dipstick SMALL (*)    All other components within normal limits  PREGNANCY, URINE  URINE MICROSCOPIC-ADD ON   Dg Abd 1 View  08/15/2011  *RADIOLOGY REPORT*  Clinical Data: Right-sided flank pain.  ABDOMEN - 1 VIEW  Comparison: CT scan from 05/20/2011  Findings: Supine view of the abdomen shows no evidence for stones over the renal shadows, expected course  of the ureters, or bladder. The bowel gas pattern is normal.  The surgical clips in the right upper quadrant suggest prior cholecystectomy.  IMPRESSION: Normal exam.  Original Report Authenticated By: ERIC A. MANSELL, M.D.     1. Flank pain       MDM  Pt comfortable at this time:will treat with something for pain     Medical screening examination/treatment/procedure(s) were performed by non-physician practitioner and as supervising physician I was immediately available for consultation/collaboration. Osvaldo Human, M.D.  Teressa Lower, NP 08/15/11 2137  Carleene Cooper III, MD 08/16/11 1325

## 2011-08-15 NOTE — ED Notes (Signed)
Back pain x 5 hours. Dysuria.

## 2011-10-20 ENCOUNTER — Encounter (HOSPITAL_BASED_OUTPATIENT_CLINIC_OR_DEPARTMENT_OTHER): Payer: Self-pay | Admitting: *Deleted

## 2011-10-20 ENCOUNTER — Emergency Department (HOSPITAL_BASED_OUTPATIENT_CLINIC_OR_DEPARTMENT_OTHER)
Admission: EM | Admit: 2011-10-20 | Discharge: 2011-10-20 | Disposition: A | Discharge status: Home / Self Care | Payer: Self-pay | Attending: Emergency Medicine | Admitting: Emergency Medicine

## 2011-10-20 DIAGNOSIS — K0889 Other specified disorders of teeth and supporting structures: Secondary | ICD-10-CM

## 2011-10-20 DIAGNOSIS — K089 Disorder of teeth and supporting structures, unspecified: Secondary | ICD-10-CM | POA: Insufficient documentation

## 2011-10-20 MED ORDER — OXYCODONE-ACETAMINOPHEN 5-325 MG PO TABS
2.0000 | ORAL_TABLET | Freq: Once | ORAL | Status: AC
  Administered 2011-10-20: 2 via ORAL
  Filled 2011-10-20: qty 2

## 2011-10-20 MED ORDER — OXYCODONE-ACETAMINOPHEN 5-325 MG PO TABS: 1.0000 | ORAL_TABLET | Freq: Four times a day (QID) | ORAL | Status: AC | PRN

## 2011-10-20 NOTE — ED Provider Notes (Signed)
History     CSN: [id number]  Arrival date & time 10/20/11  0130   None     Chief Complaint  Patient presents with  . Dental Pain     HPI The patient presents with pain in her mouth.  Her pain began gradually 2 days ago.  Since onset the pain has been persistent, worsening.  The pain is focally about her right maxilla.  The pain is nonradiating, worse with chewing.  No relief with ibuprofen area no fevers, no vomiting, no confusion, no visual changes, no superficial skin changes per the patient.  She notes that she has been unable to see a dentist, do to financial constraints. History reviewed. No pertinent past medical history.  Past Surgical History  Procedure Date  . Cholecystectomy     No family history on file.  History  Substance Use Topics  . Smoking status: Never Smoker   . Smokeless tobacco: Not on file  . Alcohol Use: No    OB History    Grav Para Term Preterm Abortions TAB SAB Ect Mult Living                  Review of Systems  All other systems reviewed and are negative.    Allergies  Kiwi extract; Other; and Penicillins  Home Medications   Current Outpatient Rx  Name Route Sig Dispense Refill  . HYDROCOD POLST-CPM POLST ER 10-8 MG/5ML PO LQCR Oral Take 5 mLs by mouth every 12 (twelve) hours as needed. 140 mL 0  . IBUPROFEN 200 MG PO TABS Oral Take 400 mg by mouth every 6 (six) hours as needed. For pain    . NAPROXEN 500 MG PO TABS Oral Take 1 tablet (500 mg total) by mouth 2 (two) times daily. 30 tablet 0  . OXYCODONE-ACETAMINOPHEN 5-325 MG PO TABS Oral Take 1 tablet by mouth every 6 (six) hours as needed for pain. 20 tablet 0    BP 126/83  Pulse 86  Temp(Src) 97.7 F (36.5 C) (Oral)  Resp 20  SpO2 100%  Physical Exam  Nursing note and vitals reviewed. Constitutional: She is oriented to person, place, and time. She appears well-developed and well-nourished. No distress.  HENT:  Head: Normocephalic and atraumatic.  Mouth/Throat: Uvula is  midline, oropharynx is clear and moist and mucous membranes are normal. No oral lesions. Abnormal dentition. Dental caries present. No dental abscesses or uvula swelling. No oropharyngeal exudate, posterior oropharyngeal edema, posterior oropharyngeal erythema or tonsillar abscesses.       Very poor dentition with nearly all superior teeth either missing or fragmented.  No appreciable erythema/edema or any pus produced from the right maxilla.  There is tenderness to palpation, without trismus or TMJ tenderness  Eyes: Conjunctivae and EOM are normal.  Neck: No thyromegaly present.  Pulmonary/Chest: Effort normal. No stridor.  Neurological: She is alert and oriented to person, place, and time. No cranial nerve deficit. She exhibits normal muscle tone. Coordination normal.  Skin: Skin is warm and dry.  Psychiatric: She has a normal mood and affect.    ED Course  Procedures (including critical care time)  Labs Reviewed - No data to display No results found.   1. Pain, dental       MDM  This female presents with several days of dental pain.  On exam the patient is afebrile, without overt evidence of abscess or infection.  The patient's condition is poor, with multiple fragmented teeth.  The patient was provided dental  referral, oral narcotics, and discharged in stable condition.        Gerhard Munch, MD 10/20/11 [phone]

## 2011-10-20 NOTE — Discharge Instructions (Signed)

## 2011-10-20 NOTE — ED Notes (Signed)
Patient has top right toothache. Started two nights ago. States that she doesn't have a Education officer, community.

## 2011-11-07 ENCOUNTER — Encounter (HOSPITAL_BASED_OUTPATIENT_CLINIC_OR_DEPARTMENT_OTHER): Payer: Self-pay | Admitting: *Deleted

## 2011-11-07 ENCOUNTER — Emergency Department (HOSPITAL_BASED_OUTPATIENT_CLINIC_OR_DEPARTMENT_OTHER)
Admission: EM | Admit: 2011-11-07 | Discharge: 2011-11-08 | Disposition: A | Discharge status: Home / Self Care | Payer: Self-pay | Attending: Emergency Medicine | Admitting: Emergency Medicine

## 2011-11-07 DIAGNOSIS — L0390 Cellulitis, unspecified: Secondary | ICD-10-CM

## 2011-11-07 DIAGNOSIS — L02219 Cutaneous abscess of trunk, unspecified: Secondary | ICD-10-CM | POA: Insufficient documentation

## 2011-11-07 DIAGNOSIS — L299 Pruritus, unspecified: Secondary | ICD-10-CM | POA: Insufficient documentation

## 2011-11-07 DIAGNOSIS — R1032 Left lower quadrant pain: Secondary | ICD-10-CM | POA: Insufficient documentation

## 2011-11-07 DIAGNOSIS — L03319 Cellulitis of trunk, unspecified: Secondary | ICD-10-CM | POA: Insufficient documentation

## 2011-11-07 MED ORDER — DOXYCYCLINE HYCLATE 100 MG PO TABS
100.0000 mg | ORAL_TABLET | Freq: Once | ORAL | Status: AC
  Administered 2011-11-07: 100 mg via ORAL
  Filled 2011-11-07: qty 1

## 2011-11-07 MED ORDER — DOXYCYCLINE HYCLATE 100 MG PO CAPS: 100.0000 mg | ORAL_CAPSULE | Freq: Two times a day (BID) | ORAL | Status: AC

## 2011-11-07 MED ORDER — HYDROCODONE-ACETAMINOPHEN 5-500 MG PO TABS: 2.0000 | ORAL_TABLET | Freq: Four times a day (QID) | ORAL | Status: AC | PRN

## 2011-11-07 MED ORDER — MUPIROCIN 2 % EX KIT: PACK | CUTANEOUS | Status: DC

## 2011-11-07 MED ORDER — TRAMADOL HCL 50 MG PO TABS
50.0000 mg | ORAL_TABLET | Freq: Once | ORAL | Status: AC
  Administered 2011-11-07: 50 mg via ORAL
  Filled 2011-11-07: qty 1

## 2011-11-07 NOTE — Discharge Instructions (Signed)
Cellulitis Cellulitis is an infection of the tissue under the skin. The infected area is usually red and tender. This is caused by germs. These germs enter the body through cuts or sores. This usually happens in the arms or lower legs. HOME CARE   Take your medicine as told. Finish it even if you start to feel better.   If the infection is on the arm or leg, keep it raised (elevated).   Use a warm cloth on the infected area several times a day.   See your doctor for a follow-up visit as told.  GET HELP RIGHT AWAY IF:   You are tired or confused.   You throw up (vomit).   You have watery poop (diarrhea).   You feel ill and have muscle aches.   You have a fever.  MAKE SURE YOU:   Understand these instructions.   Will watch your condition.   Will get help right away if you are not doing well or get worse.  Document Released: 02/08/2008 Document Revised: 08/11/2011 Document Reviewed: 07/24/2009 [employer] Patient Information 2012 [employer]. 

## 2011-11-07 NOTE — ED Provider Notes (Signed)
History  This chart was scribed for Liese Dizdarevic Smitty Cords, MD by Bennett Scrape. This patient was seen in room MH08/MH08 and the patient's care was started at 11:32PM.  CSN: [id number]  Arrival date & time 11/07/11  2313   None     Chief Complaint  Patient presents with  . Abscess    Patient is a 36 y.o. female presenting with rash. The history is provided by the patient. No language interpreter was used.  Rash  This is a new problem. The current episode started more than 1 week ago. The problem has been gradually worsening. The problem is associated with nothing. There has been no fever. The rash is present on the abdomen. The pain is at a severity of 10/10. The pain is severe. The pain has been constant since onset. Associated symptoms include itching and pain. She has tried nothing for the symptoms. The treatment provided no relief. Risk factors: none.    [name] is a 36 y.o. female who presents to the Emergency Department complaining of 2 weeks of gradual onset, gradually worsening absences like wound on the left lower abdomen. Pt reports that she has noticed that the area was growing larger and looking red over the past 3 to 4 days. She reports that she has been washing the area and keeping it clean. She reports that she has been scratching at the area today but denies any drainage or discharge. She denies any other illnesses or injuries at this time. She is a former smoker and she denies alcohol use.   History reviewed. No pertinent past medical history.  Past Surgical History  Procedure Date  . Cholecystectomy     No family history on file.  History  Substance Use Topics  . Smoking status: Former Games developer  . Smokeless tobacco: Not on file  . Alcohol Use: No     Review of Systems  Constitutional: Negative for fever and chills.  HENT: Negative for congestion, sore throat and neck pain.   Eyes: Negative for pain.  Respiratory: Negative for cough and shortness of  breath.   Cardiovascular: Negative for chest pain.  Gastrointestinal: Negative for nausea, vomiting, abdominal pain and diarrhea.  Genitourinary: Negative for dysuria and hematuria.  Musculoskeletal: Negative for back pain.  Skin: Positive for itching, rash and wound (abscess to the left lower abdomen).  Neurological: Negative for numbness and headaches.  Hematological: Negative.   Psychiatric/Behavioral: Negative.     Allergies  Kiwi extract; Other; and Penicillins  Home Medications   Current Outpatient Rx  Name Route Sig Dispense Refill  . IBUPROFEN 200 MG PO TABS Oral Take 400 mg by mouth every 6 (six) hours as needed. For pain    . HYDROCOD POLST-CPM POLST ER 10-8 MG/5ML PO LQCR Oral Take 5 mLs by mouth every 12 (twelve) hours as needed. 140 mL 0  . NAPROXEN 500 MG PO TABS Oral Take 1 tablet (500 mg total) by mouth 2 (two) times daily. 30 tablet 0    Triage Vitals: BP 146/92  Pulse 85  Temp(Src) 98.2 F (36.8 C) (Oral)  Resp 18  Ht 5\' 1"  (1.549 m)  Wt 230 lb (104.327 kg)  BMI 43.46 kg/m2  SpO2 100%  LMP 11/04/2011  Physical Exam  Nursing note and vitals reviewed. Constitutional: She is oriented to person, place, and time. She appears well-developed and well-nourished.  HENT:  Head: Normocephalic and atraumatic.  Mouth/Throat: Oropharynx is clear and moist.  Eyes: Conjunctivae and EOM are normal. Pupils  are equal, round, and reactive to light.  Neck: Normal range of motion. Neck supple.  Cardiovascular: Normal rate and regular rhythm.   Pulmonary/Chest: Effort normal and breath sounds normal.  Abdominal: Soft. Bowel sounds are normal. There is no tenderness. There is no rebound and no guarding.    Genitourinary:       No lymph nodes in the left or right groin  Musculoskeletal: Normal range of motion. She exhibits no edema.       No axillary lymph nodes  Lymphadenopathy:    She has no cervical adenopathy.  Neurological: She is alert and oriented to person,  place, and time. No cranial nerve deficit.  Skin: Skin is warm and dry.       2 cm by 1 cm Cellulitic area, left lower abdominal area, in the middle has an opening with scab on it  Psychiatric: She has a normal mood and affect. Her behavior is normal.    ED Course  Procedures (including critical care time)  DIAGNOSTIC STUDIES: Oxygen Saturation is 100% on room air, normal by my interpretation.    COORDINATION OF CARE: 11:40PM- Discussed prescription for pain and ointment with pt and pt agreed to plan. Advised pt to follow up with PCP or in this ED in a few days.   Labs Reviewed - No data to display No results found.   No diagnosis found.    MDM  Return for fevers chills nausea vomiting or worsening symptoms.  Recheck in 2 days. Patient verbalizes understanding and agrees to follow up   I personally performed the services described in this documentation, which was scribed in my presence. The recorded information has been reviewed and considered.    Jasmine Awe, MD 11/08/11 [phone]

## 2011-11-07 NOTE — ED Notes (Signed)
Pt reports redenned painful area to left lower abdomen. First noticed a couple weeks ago. States is progressively worsening.

## 2011-12-15 ENCOUNTER — Emergency Department (HOSPITAL_BASED_OUTPATIENT_CLINIC_OR_DEPARTMENT_OTHER)
Admission: EM | Admit: 2011-12-15 | Discharge: 2011-12-15 | Disposition: A | Discharge status: Home / Self Care | Payer: Self-pay | Attending: Emergency Medicine | Admitting: Emergency Medicine

## 2011-12-15 ENCOUNTER — Emergency Department (INDEPENDENT_AMBULATORY_CARE_PROVIDER_SITE_OTHER): Payer: Self-pay

## 2011-12-15 ENCOUNTER — Encounter (HOSPITAL_BASED_OUTPATIENT_CLINIC_OR_DEPARTMENT_OTHER): Payer: Self-pay | Admitting: *Deleted

## 2011-12-15 DIAGNOSIS — M25519 Pain in unspecified shoulder: Secondary | ICD-10-CM

## 2011-12-15 DIAGNOSIS — S7000XA Contusion of unspecified hip, initial encounter: Secondary | ICD-10-CM | POA: Insufficient documentation

## 2011-12-15 DIAGNOSIS — Y9301 Activity, walking, marching and hiking: Secondary | ICD-10-CM | POA: Insufficient documentation

## 2011-12-15 DIAGNOSIS — S40019A Contusion of unspecified shoulder, initial encounter: Secondary | ICD-10-CM | POA: Insufficient documentation

## 2011-12-15 DIAGNOSIS — W1809XA Striking against other object with subsequent fall, initial encounter: Secondary | ICD-10-CM | POA: Insufficient documentation

## 2011-12-15 DIAGNOSIS — M25559 Pain in unspecified hip: Secondary | ICD-10-CM

## 2011-12-15 DIAGNOSIS — W19XXXA Unspecified fall, initial encounter: Secondary | ICD-10-CM

## 2011-12-15 DIAGNOSIS — S7001XA Contusion of right hip, initial encounter: Secondary | ICD-10-CM

## 2011-12-15 DIAGNOSIS — IMO0001 Reserved for inherently not codable concepts without codable children: Secondary | ICD-10-CM | POA: Insufficient documentation

## 2011-12-15 DIAGNOSIS — S40011A Contusion of right shoulder, initial encounter: Secondary | ICD-10-CM

## 2011-12-15 MED ORDER — HYDROCODONE-ACETAMINOPHEN 5-325 MG PO TABS: 2.0000 | ORAL_TABLET | ORAL | Status: AC | PRN

## 2011-12-15 MED ORDER — HYDROCODONE-ACETAMINOPHEN 5-325 MG PO TABS
2.0000 | ORAL_TABLET | Freq: Once | ORAL | Status: AC
  Administered 2011-12-15: 2 via ORAL
  Filled 2011-12-15: qty 2

## 2011-12-15 NOTE — ED Notes (Signed)
Patient transported to X-ray 
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
## 2011-12-15 NOTE — Discharge Instructions (Signed)
Contusion  A contusion is a deep bruise. Contusions happen when an injury causes bleeding under the skin. Signs of bruising include pain, puffiness (swelling), and discolored skin. The contusion may turn blue, purple, or yellow.  HOME CARE    Put ice on the injured area.   Put ice in a plastic bag.   Place a towel between your skin and the bag.   Leave the ice on for 15 to 20 minutes, 3 to 4 times a day.   Only take medicine as told by your doctor.   Rest the injured area.   If possible, raise (elevate) the injured area to lessen puffiness.  GET HELP RIGHT AWAY IF:    You have more bruising or puffiness.   You have pain that is getting worse.   Your puffiness or pain is not helped by medicine.  MAKE SURE YOU:    Understand these instructions.   Will watch your condition.   Will get help right away if you are not doing well or get worse.  Document Released: 02/08/2008 Document Revised: 08/11/2011 Document Reviewed: 06/27/2011  [employer] Patient Information 2012 [employer].

## 2011-12-15 NOTE — ED Provider Notes (Signed)
History     CSN: [id number]  Arrival date & time 12/15/11  2114   First MD Initiated Contact with Patient 12/15/11 2143      Chief Complaint  Patient presents with  . Fall    (Consider location/radiation/quality/duration/timing/severity/associated sxs/prior treatment) Patient is a 36 y.o. female presenting with fall. The history is provided by the patient. No language interpreter was used.  Fall The accident occurred 3 to 5 hours ago. The fall occurred while walking. She fell from a height of 1 to 2 ft. She landed on dirt. The point of impact was the right shoulder. The pain is present in the right shoulder. The pain is at a severity of 6/10. The pain is moderate. She was ambulatory at the scene. There was entrapment after the fall. She has tried nothing for the symptoms.    History reviewed. No pertinent past medical history.  Past Surgical History  Procedure Date  . Cholecystectomy     No family history on file.  History  Substance Use Topics  . Smoking status: Former Games developer  . Smokeless tobacco: Not on file  . Alcohol Use: No    OB History    Grav Para Term Preterm Abortions TAB SAB Ect Mult Living                  Review of Systems  Musculoskeletal: Positive for myalgias and joint swelling.  All other systems reviewed and are negative.    Allergies  Kiwi extract; Other; and Penicillins  Home Medications   Current Outpatient Rx  Name Route Sig Dispense Refill  . IBUPROFEN 200 MG PO TABS Oral Take 400 mg by mouth every 6 (six) hours as needed. For pain      BP 130/92  Pulse 99  Temp(Src) 97.8 F (36.6 C) (Oral)  Resp 20  SpO2 100%  LMP 12/08/2011  Physical Exam  Nursing note and vitals reviewed. Constitutional: She is oriented to person, place, and time. She appears well-developed and well-nourished.  HENT:  Head: Normocephalic and atraumatic.  Eyes: Conjunctivae are normal. Pupils are equal, round, and reactive to light.  Neck: Normal range  of motion. Neck supple.  Cardiovascular: Normal rate.   Pulmonary/Chest: Effort normal.  Abdominal: Soft.  Musculoskeletal: She exhibits edema and tenderness.       Tender right hip and right shoulder  Neurological: She is alert and oriented to person, place, and time. She has normal reflexes.  Skin: Skin is warm.  Psychiatric: She has a normal mood and affect.    ED Course  Procedures (including critical care time)  Labs Reviewed - No data to display No results found.   No diagnosis found.    MDM   Results for orders placed during the hospital encounter of 08/15/11  URINALYSIS, ROUTINE W REFLEX MICROSCOPIC      Component Value Range   Color, Urine YELLOW  YELLOW    APPearance CLEAR  CLEAR    Specific Gravity, Urine 1.015  1.005 - 1.030    pH 5.5  5.0 - 8.0    Glucose, UA NEGATIVE  NEGATIVE (mg/dL)   Hgb urine dipstick SMALL (*) NEGATIVE    Bilirubin Urine NEGATIVE  NEGATIVE    Ketones, ur NEGATIVE  NEGATIVE (mg/dL)   Protein, ur NEGATIVE  NEGATIVE (mg/dL)   Urobilinogen, UA 0.2  0.0 - 1.0 (mg/dL)   Nitrite NEGATIVE  NEGATIVE    Leukocytes, UA NEGATIVE  NEGATIVE   PREGNANCY, URINE  Component Value Range   Preg Test, Ur NEGATIVE    URINE MICROSCOPIC-ADD ON      Component Value Range   Squamous Epithelial / LPF RARE  RARE    RBC / HPF 3-6  <3 (RBC/hpf)   Bacteria, UA RARE  RARE    No results found.         Lonia Skinner [place], [place] 12/15/11 2154  Lonia Skinner [place], [place] 12/15/11 2206

## 2011-12-16 NOTE — ED Provider Notes (Signed)
Medical screening examination/treatment/procedure(s) were performed by non-physician practitioner and as supervising physician I was immediately available for consultation/collaboration.  Cyndra Numbers, MD 12/16/11 1600

## 2011-12-24 ENCOUNTER — Emergency Department (HOSPITAL_BASED_OUTPATIENT_CLINIC_OR_DEPARTMENT_OTHER)
Admission: EM | Admit: 2011-12-24 | Discharge: 2011-12-24 | Disposition: A | Discharge status: Home / Self Care | Payer: Self-pay | Attending: Emergency Medicine | Admitting: Emergency Medicine

## 2011-12-24 ENCOUNTER — Encounter (HOSPITAL_BASED_OUTPATIENT_CLINIC_OR_DEPARTMENT_OTHER): Payer: Self-pay | Admitting: *Deleted

## 2011-12-24 ENCOUNTER — Emergency Department (INDEPENDENT_AMBULATORY_CARE_PROVIDER_SITE_OTHER): Payer: Self-pay

## 2011-12-24 DIAGNOSIS — S8990XA Unspecified injury of unspecified lower leg, initial encounter: Secondary | ICD-10-CM | POA: Insufficient documentation

## 2011-12-24 DIAGNOSIS — M25569 Pain in unspecified knee: Secondary | ICD-10-CM | POA: Insufficient documentation

## 2011-12-24 DIAGNOSIS — X500XXA Overexertion from strenuous movement or load, initial encounter: Secondary | ICD-10-CM | POA: Insufficient documentation

## 2011-12-24 DIAGNOSIS — S99919A Unspecified injury of unspecified ankle, initial encounter: Secondary | ICD-10-CM | POA: Insufficient documentation

## 2011-12-24 MED ORDER — NAPROXEN 500 MG PO TABS: 500.0000 mg | ORAL_TABLET | Freq: Two times a day (BID) | ORAL | Status: AC

## 2011-12-24 MED ORDER — OXYCODONE-ACETAMINOPHEN 5-325 MG PO TABS
2.0000 | ORAL_TABLET | Freq: Once | ORAL | Status: AC
  Administered 2011-12-24: 2 via ORAL
  Filled 2011-12-24: qty 2

## 2011-12-24 MED ORDER — IBUPROFEN 800 MG PO TABS
800.0000 mg | ORAL_TABLET | Freq: Once | ORAL | Status: AC
  Administered 2011-12-24: 800 mg via ORAL
  Filled 2011-12-24: qty 1

## 2011-12-24 MED ORDER — TRAMADOL HCL 50 MG PO TABS: 50.0000 mg | ORAL_TABLET | Freq: Four times a day (QID) | ORAL | Status: AC | PRN

## 2011-12-24 NOTE — Discharge Instructions (Signed)
Knee Pain The knee is the complex joint between your thigh and your lower leg. It is made up of bones, tendons, ligaments, and cartilage. The bones that make up the knee are:  The femur in the thigh.   The tibia and fibula in the lower leg.   The patella or kneecap riding in the groove on the lower femur.  CAUSES  Knee pain is a common complaint with many causes. A few of these causes are:  Injury, such as:   A ruptured ligament or tendon injury.   Torn cartilage.   Medical conditions, such as:   Gout   Arthritis   Infections   Overuse, over training or overdoing a physical activity.  Knee pain can be minor or severe. Knee pain can accompany debilitating injury. Minor knee problems often respond well to self-care measures or get well on their own. More serious injuries may need medical intervention or even surgery. SYMPTOMS The knee is complex. Symptoms of knee problems can vary widely. Some of the problems are:  Pain with movement and weight bearing.   Swelling and tenderness.   Buckling of the knee.   Inability to straighten or extend your knee.   Your knee locks and you cannot straighten it.   Warmth and redness with pain and fever.   Deformity or dislocation of the kneecap.  DIAGNOSIS  Determining what is wrong may be very straight forward such as when there is an injury. It can also be challenging because of the complexity of the knee. Tests to make a diagnosis may include:  Your caregiver taking a history and doing a physical exam.   Routine X-rays can be used to rule out other problems. X-rays will not reveal a cartilage tear. Some injuries of the knee can be diagnosed by:   Arthroscopy a surgical technique by which a small video camera is inserted through tiny incisions on the sides of the knee. This procedure is used to examine and repair internal knee joint problems. Tiny instruments can be used during arthroscopy to repair the torn knee cartilage  (meniscus).   Arthrography is a radiology technique. A contrast liquid is directly injected into the knee joint. Internal structures of the knee joint then become visible on X-ray film.   An MRI scan is a non x-ray radiology procedure in which magnetic fields and a computer produce two- or three-dimensional images of the inside of the knee. Cartilage tears are often visible using an MRI scanner. MRI scans have largely replaced arthrography in diagnosing cartilage tears of the knee.   Blood work.   Examination of the fluid that helps to lubricate the knee joint (synovial fluid). This is done by taking a sample out using a needle and a syringe.  TREATMENT The treatment of knee problems depends on the cause. Some of these treatments are:  Depending on the injury, proper casting, splinting, surgery or physical therapy care will be needed.   Give yourself adequate recovery time. Do not overuse your joints. If you begin to get sore during workout routines, back off. Slow down or do fewer repetitions.   For repetitive activities such as cycling or running, maintain your strength and nutrition.   Alternate muscle groups. For example if you are a weight lifter, work the upper body on one day and the lower body the next.   Either tight or weak muscles do not give the proper support for your knee. Tight or weak muscles do not absorb the stress placed   on the knee joint. Keep the muscles surrounding the knee strong.   Take care of mechanical problems.   If you have flat feet, orthotics or special shoes may help. See your caregiver if you need help.   Arch supports, sometimes with wedges on the inner or outer aspect of the heel, can help. These can shift pressure away from the side of the knee most bothered by osteoarthritis.   A brace called an "unloader" brace also may be used to help ease the pressure on the most arthritic side of the knee.   If your caregiver has prescribed crutches, braces,  wraps or ice, use as directed. The acronym for this is PRICE. This means protection, rest, ice, compression and elevation.   Nonsteroidal anti-inflammatory drugs (NSAID's), can help relieve pain. But if taken immediately after an injury, they may actually increase swelling. Take NSAID's with food in your stomach. Stop them if you develop stomach problems. Do not take these if you have a history of ulcers, stomach pain or bleeding from the bowel. Do not take without your caregiver's approval if you have problems with fluid retention, heart failure, or kidney problems.   For ongoing knee problems, physical therapy may be helpful.   Glucosamine and chondroitin are over-the-counter dietary supplements. Both may help relieve the pain of osteoarthritis in the knee. These medicines are different from the usual anti-inflammatory drugs. Glucosamine may decrease the rate of cartilage destruction.   Injections of a corticosteroid drug into your knee joint may help reduce the symptoms of an arthritis flare-up. They may provide pain relief that lasts a few months. You may have to wait a few months between injections. The injections do have a small increased risk of infection, water retention and elevated blood sugar levels.   Hyaluronic acid injected into damaged joints may ease pain and provide lubrication. These injections may work by reducing inflammation. A series of shots may give relief for as long as 6 months.   Topical painkillers. Applying certain ointments to your skin may help relieve the pain and stiffness of osteoarthritis. Ask your pharmacist for suggestions. Many over the-counter products are approved for temporary relief of arthritis pain.   In some countries, doctors often prescribe topical NSAID's for relief of chronic conditions such as arthritis and tendinitis. A review of treatment with NSAID creams found that they worked as well as oral medications but without the serious side effects.    PREVENTION  Maintain a healthy weight. Extra pounds put more strain on your joints.   Get strong, stay limber. Weak muscles are a common cause of knee injuries. Stretching is important. Include flexibility exercises in your workouts.   Be smart about exercise. If you have osteoarthritis, chronic knee pain or recurring injuries, you may need to change the way you exercise. This does not mean you have to stop being active. If your knees ache after jogging or playing basketball, consider switching to swimming, water aerobics or other low-impact activities, at least for a few days a week. Sometimes limiting high-impact activities will provide relief.   Make sure your shoes fit well. Choose footwear that is right for your sport.   Protect your knees. Use the proper gear for knee-sensitive activities. Use kneepads when playing volleyball or laying carpet. Buckle your seat belt every time you drive. Most shattered kneecaps occur in car accidents.   Rest when you are tired.  SEEK MEDICAL CARE IF:  You have knee pain that is continual and does not   seem to be getting better.  SEEK IMMEDIATE MEDICAL CARE IF:  Your knee joint feels hot to the touch and you have a high fever. MAKE SURE YOU:   Understand these instructions.   Will watch your condition.   Will get help right away if you are not doing well or get worse.  Document Released: 06/19/2007 Document Revised: 08/11/2011 Document Reviewed: 06/19/2007 [employer] Patient Information 2012 [employer]. 

## 2011-12-24 NOTE — ED Notes (Signed)
Patient transported to X-ray 

## 2011-12-24 NOTE — ED Notes (Signed)
Pt states she twisted it when she stepped in a hole. Pt is able to bear weight. Pt has been seen here numerous times for similar injuries.

## 2011-12-24 NOTE — ED Provider Notes (Signed)
History     CSN: [id number]  Arrival date & time 12/24/11  0206   First MD Initiated Contact with Patient 12/24/11 [phone]      Chief Complaint  Patient presents with  . Knee Injury    (Consider location/radiation/quality/duration/timing/severity/associated sxs/prior treatment) HPI Comments: Patient states she has right knee pain after stepping into a hole and twisting it. She's able to bear weight. Has been seen here numerous times for similar injuries. She denies paresthesias or weakness. No other injuries  Patient is a 36 y.o. female presenting with knee pain. The history is provided by the patient. No language interpreter was used.  Knee Pain This is a new problem. The current episode started 6 to 12 hours ago. The problem occurs constantly. The problem has been gradually worsening. Pertinent negatives include no chest pain, no abdominal pain, no headaches and no shortness of breath. The symptoms are aggravated by walking, bending and twisting. The symptoms are relieved by nothing. She has tried nothing for the symptoms.    History reviewed. No pertinent past medical history.  Past Surgical History  Procedure Date  . Cholecystectomy     History reviewed. No pertinent family history.  History  Substance Use Topics  . Smoking status: Former Games developer  . Smokeless tobacco: Not on file  . Alcohol Use: No    OB History    Grav Para Term Preterm Abortions TAB SAB Ect Mult Living                  Review of Systems  Constitutional: Negative for fever, activity change, appetite change and fatigue.  HENT: Negative for congestion, sore throat, rhinorrhea, neck pain and neck stiffness.   Respiratory: Negative for cough and shortness of breath.   Cardiovascular: Negative for chest pain and palpitations.  Gastrointestinal: Negative for nausea, vomiting and abdominal pain.  Genitourinary: Negative for dysuria, urgency, frequency and flank pain.  Musculoskeletal: Positive for  arthralgias. Negative for myalgias, back pain and joint swelling.  Neurological: Negative for dizziness, weakness, light-headedness, numbness and headaches.  All other systems reviewed and are negative.    Allergies  Kiwi extract; Other; and Penicillins  Home Medications   Current Outpatient Rx  Name Route Sig Dispense Refill  . HYDROCODONE-ACETAMINOPHEN 5-325 MG PO TABS Oral Take 2 tablets by mouth every 4 (four) hours as needed for pain. 16 tablet 0  . IBUPROFEN 200 MG PO TABS Oral Take 400 mg by mouth every 6 (six) hours as needed. For pain    . NAPROXEN 500 MG PO TABS Oral Take 1 tablet (500 mg total) by mouth 2 (two) times daily. 30 tablet 0  . TRAMADOL HCL 50 MG PO TABS Oral Take 1 tablet (50 mg total) by mouth every 6 (six) hours as needed for pain. 15 tablet 0    BP 137/73  Pulse 80  Temp(Src) 97.9 F (36.6 C) (Oral)  Resp 20  Ht 5\' 1"  (1.549 m)  Wt 245 lb (111.131 kg)  BMI 46.29 kg/m2  SpO2 98%  LMP 12/08/2011  Physical Exam  Nursing note and vitals reviewed. Constitutional: She is oriented to person, place, and time. She appears well-developed and well-nourished. No distress.       Morbidly obese  HENT:  Head: Normocephalic and atraumatic.  Mouth/Throat: Oropharynx is clear and moist.  Eyes: Conjunctivae and EOM are normal. Pupils are equal, round, and reactive to light.  Neck: Normal range of motion. Neck supple.  Cardiovascular: Normal rate, regular rhythm, normal heart  sounds and intact distal pulses.  Exam reveals no gallop and no friction rub.   No murmur heard. Pulmonary/Chest: Effort normal and breath sounds normal. No respiratory distress. She exhibits no tenderness.  Abdominal: Soft. Bowel sounds are normal. There is no tenderness. There is no rebound and no guarding.  Musculoskeletal:       Right knee: She exhibits decreased range of motion. She exhibits no swelling, no effusion, no ecchymosis, no deformity and no LCL laxity. tenderness found. Medial  joint line and lateral joint line tenderness noted. No MCL, no LCL and no patellar tendon tenderness noted.  Neurological: She is alert and oriented to person, place, and time. She has normal strength and normal reflexes. No cranial nerve deficit or sensory deficit.  Skin: Skin is warm and dry. No rash noted.    ED Course  Procedures (including critical care time)  Labs Reviewed - No data to display Dg Knee Complete 4 Views Right  12/24/2011  *RADIOLOGY REPORT*  Clinical Data: Anterior knee pain and difficulty bearing weight after twisting injury on Friday.  RIGHT KNEE - COMPLETE 4+ VIEW  Comparison: None.  Findings: Medial compartment narrowing with mild hypertrophic changes consistent with degenerative change.  Hypertrophic changes on the patella.  There is a small right knee effusion.  No evidence of acute fracture or subluxation.  No focal bone lesion or bone destruction.  Bone cortex and trabecular architecture appear intact.  No abnormal periosteal reaction.  No radiopaque soft tissue foreign bodies.  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  No acute bony abnormalities are appreciated.  Original Report Authenticated By: Marlon Pel, M.D.     1. Knee injury       MDM  No acute bony abnormalities seen on imaging. She is provided dose of pain medication an NSAID in the emergency department. She'll be discharged home with pain medication and instructions to followup with sports medicine.        Dayton Bailiff, MD 12/24/11 [phone]

## 2011-12-24 NOTE — ED Notes (Signed)
MD at bedside. 

## 2012-01-17 ENCOUNTER — Encounter (HOSPITAL_BASED_OUTPATIENT_CLINIC_OR_DEPARTMENT_OTHER): Payer: Self-pay | Admitting: *Deleted

## 2012-01-17 ENCOUNTER — Emergency Department (HOSPITAL_BASED_OUTPATIENT_CLINIC_OR_DEPARTMENT_OTHER)
Admission: EM | Admit: 2012-01-17 | Discharge: 2012-01-17 | Disposition: A | Discharge status: Home / Self Care | Payer: Self-pay | Attending: Emergency Medicine | Admitting: Emergency Medicine

## 2012-01-17 DIAGNOSIS — K029 Dental caries, unspecified: Secondary | ICD-10-CM | POA: Insufficient documentation

## 2012-01-17 DIAGNOSIS — K089 Disorder of teeth and supporting structures, unspecified: Secondary | ICD-10-CM | POA: Insufficient documentation

## 2012-01-17 MED ORDER — CLINDAMYCIN HCL 300 MG PO CAPS: 300.0000 mg | ORAL_CAPSULE | Freq: Four times a day (QID) | ORAL | Status: AC

## 2012-01-17 MED ORDER — TRAMADOL HCL 50 MG PO TABS
ORAL_TABLET | ORAL | Status: AC
  Filled 2012-01-17: qty 1

## 2012-01-17 MED ORDER — TRAMADOL HCL 50 MG PO TABS
50.0000 mg | ORAL_TABLET | Freq: Once | ORAL | Status: AC
  Administered 2012-01-17: 50 mg via ORAL

## 2012-01-17 MED ORDER — HYDROCODONE-ACETAMINOPHEN 5-500 MG PO TABS
1.0000 | ORAL_TABLET | Freq: Four times a day (QID) | ORAL | Status: AC | PRN
Start: 2012-01-17 — End: 2012-01-27

## 2012-01-17 NOTE — Discharge Instructions (Signed)
Dental Caries Dental caries (cavities) are areas of tooth decay. Cavities are usually caused by a combination of poor dental care; sugar; tobacco, alcohol, and drug abuse; decreased saliva production; and receding gums. If cavities are not treated by a dentist, they grow in size. This can cause toothaches, infection, and loss of the tooth. Cavities of the outer tooth enamel do not cause symptoms. Dental pain from cold drinks may be the first sign the enamel has broken down and decay has spread toward the root of the tooth. This can cause the tooth to die or become infected. If a cavity is treated before it causes toothache, the tooth can usually be saved. Cavities can be prevented by good oral hygiene. Brushing your teeth in the morning and before bed, and using dental floss once daily helps remove plaque and reduce bacteria. Candy, soft drinks, and other sources of sugar promote tooth decay by promoting the growth of bacteria in the mouth. Proper diet, fluoride, dental cleaning, and fillings are important in preventing the loss of teeth from decay. Antibiotics, root canal treatment, or dental extraction may be needed if the decay is severe. Take any pain medication or antibiotics as directed by your caregiver. It is important that you follow up with a dentist for definitive care. SEEK MEDICAL CARE IF:   You or your child has an oral temperature above 102 F (38.9 C).   There is difficulty opening the mouth.   There is difficulty swallowing or handling secretions.   There is difficulty breathing.   There is chest pain.   There are worsening or concerning symptoms.  Document Released: 09/29/2004 Document Revised: 08/11/2011 Document Reviewed: 12/15/2009 [facility] Patient Information 2012 [place], [place].

## 2012-01-17 NOTE — ED Provider Notes (Addendum)
History     CSN: [id number]  Arrival date & time 01/17/12  3086   First MD Initiated Contact with Patient 01/17/12 [phone]      Chief Complaint  Patient presents with  . Dental Pain    (Consider location/radiation/quality/duration/timing/severity/associated sxs/prior treatment) Patient is a 36 y.o. female presenting with tooth pain. The history is provided by the patient. No language interpreter was used.  Dental PainPrimary symptoms do not include dental injury, oral bleeding, sore throat, angioedema or cough. Primary symptoms comment: broken teeth The symptoms began more than 1 month ago. The symptoms are worsening. The symptoms are recurrent. The symptoms occur constantly.  Additional symptoms include: dental sensitivity to temperature. Additional symptoms do not include: gum swelling and trouble swallowing. Medical issues do not include: alcohol problem.    History reviewed. No pertinent past medical history.  Past Surgical History  Procedure Date  . Cholecystectomy     History reviewed. No pertinent family history.  History  Substance Use Topics  . Smoking status: Former Games developer  . Smokeless tobacco: Not on file  . Alcohol Use: No    OB History    Grav Para Term Preterm Abortions TAB SAB Ect Mult Living                  Review of Systems  HENT: Negative for sore throat and trouble swallowing.   Respiratory: Negative for cough.   All other systems reviewed and are negative.    Allergies  Kiwi extract; Other; and Penicillins  Home Medications   Current Outpatient Rx  Name Route Sig Dispense Refill  . IBUPROFEN 200 MG PO TABS Oral Take 400 mg by mouth every 6 (six) hours as needed. For pain    . NAPROXEN 500 MG PO TABS Oral Take 1 tablet (500 mg total) by mouth 2 (two) times daily. 30 tablet 0    BP 148/119  Pulse 97  Temp(Src) 97.7 F (36.5 C) (Oral)  Resp 18  Ht 5\' 1"  (1.549 m)  Wt 240 lb (108.863 kg)  BMI 45.35 kg/m2  SpO2 100%  Physical Exam    Constitutional: She is oriented to person, place, and time. She appears well-developed and well-nourished. No distress.  HENT:  Head: No trismus in the jaw.  Mouth/Throat: Oropharynx is clear and moist. Dental caries present.    Eyes: Pupils are equal, round, and reactive to light.  Neck: Normal range of motion. Neck supple.  Cardiovascular: Normal rate and regular rhythm.   Pulmonary/Chest: Effort normal and breath sounds normal.  Abdominal: Soft. Bowel sounds are normal.  Lymphadenopathy:    She has no cervical adenopathy.  Neurological: She is alert and oriented to person, place, and time.  Skin: Skin is warm and dry.  Psychiatric: She has a normal mood and affect.    ED Course  Procedures (including critical care time)  Labs Reviewed - No data to display No results found.   No diagnosis found.    MDM  Take all antibiotics and follow up with dentist.  Patient verbalizes understanding and agrees to follow up        Emmry Hinsch K Jamayia Croker-Rasch, MD 01/17/12 0320  Mckyle Solanki Smitty Cords, MD 01/17/12 [id number]

## 2012-01-17 NOTE — ED Notes (Signed)
C/o left lower tooth pain for few hours

## 2012-04-08 ENCOUNTER — Encounter (HOSPITAL_BASED_OUTPATIENT_CLINIC_OR_DEPARTMENT_OTHER): Payer: Self-pay | Admitting: *Deleted

## 2012-04-08 ENCOUNTER — Emergency Department (HOSPITAL_BASED_OUTPATIENT_CLINIC_OR_DEPARTMENT_OTHER)
Admission: EM | Admit: 2012-04-08 | Discharge: 2012-04-08 | Disposition: A | Discharge status: Home / Self Care | Payer: Self-pay | Attending: Emergency Medicine | Admitting: Emergency Medicine

## 2012-04-08 DIAGNOSIS — M545 Low back pain, unspecified: Secondary | ICD-10-CM | POA: Insufficient documentation

## 2012-04-08 DIAGNOSIS — Z87891 Personal history of nicotine dependence: Secondary | ICD-10-CM | POA: Insufficient documentation

## 2012-04-08 DIAGNOSIS — Z9089 Acquired absence of other organs: Secondary | ICD-10-CM | POA: Insufficient documentation

## 2012-04-08 MED ORDER — OXYCODONE-ACETAMINOPHEN 5-325 MG PO TABS: 2.0000 | ORAL_TABLET | ORAL | Status: AC | PRN

## 2012-04-08 MED ORDER — CYCLOBENZAPRINE HCL 10 MG PO TABS: 10.0000 mg | ORAL_TABLET | Freq: Two times a day (BID) | ORAL | Status: AC | PRN

## 2012-04-08 MED ORDER — CYCLOBENZAPRINE HCL 10 MG PO TABS
10.0000 mg | ORAL_TABLET | Freq: Once | ORAL | Status: AC
  Administered 2012-04-08: 10 mg via ORAL
  Filled 2012-04-08: qty 1

## 2012-04-08 MED ORDER — HYDROMORPHONE HCL PF 1 MG/ML IJ SOLN
1.0000 mg | Freq: Once | INTRAMUSCULAR | Status: AC
  Administered 2012-04-08: 1 mg via INTRAMUSCULAR
  Filled 2012-04-08: qty 1

## 2012-04-08 MED ORDER — IBUPROFEN 800 MG PO TABS: 800.0000 mg | ORAL_TABLET | Freq: Three times a day (TID) | ORAL | Status: AC

## 2012-04-08 NOTE — ED Notes (Signed)
Opitz MD at bedside. 

## 2012-04-08 NOTE — ED Notes (Signed)
Pt reports right side back pain radiating down right leg since last night - denies known injury

## 2012-04-08 NOTE — ED Notes (Signed)
rx x 3 given for flexeril, percocet and ibuprofen- d/c home with ride

## 2012-04-08 NOTE — ED Provider Notes (Signed)
History     CSN: [id number]  Arrival date & time 04/08/12  2241   None     Chief Complaint  Patient presents with  . Back Pain    (Consider location/radiation/quality/duration/timing/severity/associated sxs/prior treatment) HPI Hx per PT.  R sided LBP.  Onset last night. Has h/o LBP in the past. Has never had an MRI. Is followed by [facility] in [place]  Has pain that radiates to RLE. No associated weakness or numbness, hurts to walk. Took ibuprofen with minimal relief. No incontinence. No fevers. Mod in severity.  History reviewed. No pertinent past medical history.  Past Surgical History  Procedure Date  . Cholecystectomy     No family history on file.  History  Substance Use Topics  . Smoking status: Former Games developer  . Smokeless tobacco: Never Used  . Alcohol Use: No    OB History    Grav Para Term Preterm Abortions TAB SAB Ect Mult Living                  Review of Systems  Constitutional: Negative for fever and chills.  HENT: Negative for neck pain and neck stiffness.   Eyes: Negative for pain.  Respiratory: Negative for shortness of breath.   Cardiovascular: Negative for chest pain.  Gastrointestinal: Negative for abdominal pain.  Genitourinary: Negative for dysuria, frequency, flank pain and vaginal discharge.  Musculoskeletal: Positive for back pain.  Skin: Negative for rash.  Neurological: Negative for headaches.  All other systems reviewed and are negative.    Allergies  Kiwi extract; Other; and Penicillins  Home Medications   Current Outpatient Rx  Name Route Sig Dispense Refill  . IBUPROFEN 200 MG PO TABS Oral Take 400 mg by mouth every 6 (six) hours as needed. For pain    . NAPROXEN 500 MG PO TABS Oral Take 1 tablet (500 mg total) by mouth 2 (two) times daily. 30 tablet 0    BP 124/74  Pulse 94  Temp 98.3 F (36.8 C) (Oral)  Resp 20  Ht 5\' 2"  (1.575 m)  Wt 235 lb (106.595 kg)  BMI 42.98 kg/m2  SpO2 99%  LMP  04/05/2012  Physical Exam  Constitutional: She is oriented to person, place, and time. She appears well-developed and well-nourished.  HENT:  Head: Normocephalic and atraumatic.  Eyes: Conjunctivae and EOM are normal. Pupils are equal, round, and reactive to light.  Neck: Full passive range of motion without pain. Neck supple. No thyromegaly present.       No cervical tenderness  Cardiovascular: Normal rate, regular rhythm, S1 normal, S2 normal and intact distal pulses.   Pulmonary/Chest: Effort normal and breath sounds normal.  Abdominal: Soft. Bowel sounds are normal. There is no tenderness. There is no CVA tenderness.  Musculoskeletal: Normal range of motion.       Tender R paralumbar. No midline tenderness. No lesion or erythema.  no LE deficits with equal DTRs, strengths and sensorium to light touch. Gait intact.  Neurological: She is alert and oriented to person, place, and time. She has normal strength and normal reflexes. No cranial nerve deficit or sensory deficit. She displays a negative Romberg sign. GCS eye subscore is 4. GCS verbal subscore is 5. GCS motor subscore is 6.       Normal Gait  Skin: Skin is warm and dry. No rash noted. No cyanosis. Nails show no clubbing.  Psychiatric: She has a normal mood and affect. Her speech is normal and behavior is  normal.    ED Course  Procedures (including critical care time)  LBP  No midline tenderness or indication for x-rays.  No red flags to suggest need for emergent MRI.   IM dilaudid, flexeril PO, and plan discharge home with Rx, with LBP precautions verbalized as understood, with written precautions provided and return here for worsening condition or f/u PCP for recheck. PT agreeable to plan and all questions answered prior to discharge home.  MDM   Nurisng notes reviewed. Vital signs reviewed. IM narcotic medication provided. Old records reviewed.         Sunnie Nielsen, MD 04/08/12 [phone]

## 2012-05-22 ENCOUNTER — Emergency Department (HOSPITAL_BASED_OUTPATIENT_CLINIC_OR_DEPARTMENT_OTHER): Payer: Self-pay

## 2012-05-22 ENCOUNTER — Encounter (HOSPITAL_BASED_OUTPATIENT_CLINIC_OR_DEPARTMENT_OTHER): Payer: Self-pay

## 2012-05-22 ENCOUNTER — Emergency Department (HOSPITAL_BASED_OUTPATIENT_CLINIC_OR_DEPARTMENT_OTHER)
Admission: EM | Admit: 2012-05-22 | Discharge: 2012-05-22 | Disposition: A | Discharge status: Home / Self Care | Payer: Self-pay | Attending: Emergency Medicine | Admitting: Emergency Medicine

## 2012-05-22 DIAGNOSIS — W19XXXA Unspecified fall, initial encounter: Secondary | ICD-10-CM | POA: Insufficient documentation

## 2012-05-22 DIAGNOSIS — Z880 Allergy status to penicillin: Secondary | ICD-10-CM | POA: Insufficient documentation

## 2012-05-22 DIAGNOSIS — S93409A Sprain of unspecified ligament of unspecified ankle, initial encounter: Secondary | ICD-10-CM

## 2012-05-22 DIAGNOSIS — S93499A Sprain of other ligament of unspecified ankle, initial encounter: Secondary | ICD-10-CM | POA: Insufficient documentation

## 2012-05-22 DIAGNOSIS — S96819A Strain of other specified muscles and tendons at ankle and foot level, unspecified foot, initial encounter: Secondary | ICD-10-CM | POA: Insufficient documentation

## 2012-05-22 MED ORDER — IBUPROFEN 800 MG PO TABS
800.0000 mg | ORAL_TABLET | Freq: Once | ORAL | Status: AC
  Administered 2012-05-22: 800 mg via ORAL
  Filled 2012-05-22: qty 1

## 2012-05-22 MED ORDER — IBUPROFEN 800 MG PO TABS: 800.0000 mg | ORAL_TABLET | Freq: Three times a day (TID) | ORAL | Status: DC

## 2012-05-22 NOTE — ED Provider Notes (Signed)
Medical screening examination/treatment/procedure(s) were performed by non-physician practitioner and as supervising physician I was immediately available for consultation/collaboration.   Celene Kras, MD 05/22/12 [phone]

## 2012-05-22 NOTE — ED Notes (Signed)
Injury to left foot/ankle after a fall today.

## 2012-05-22 NOTE — ED Provider Notes (Signed)
History     CSN: [id number]  Arrival date & time 05/22/12  1543   First MD Initiated Contact with Patient 05/22/12 1605      Chief Complaint  Patient presents with  . Foot Injury  . Ankle Pain    (Consider location/radiation/quality/duration/timing/severity/associated sxs/prior treatment) Patient is a 36 y.o. female presenting with foot injury and ankle pain. The history is provided by the patient. No language interpreter was used.  Foot Injury  The incident occurred 1 to 2 hours ago. The incident occurred at home. The injury mechanism was a fall. The pain is present in the left ankle and left knee. The quality of the pain is described as aching and sharp. The pain is at a severity of 7/10. The pain is moderate. The pain has been constant since onset. Associated symptoms include inability to bear weight and loss of motion. Pertinent negatives include no muscle weakness, no loss of sensation and no tingling. She reports no foreign bodies present. The symptoms are aggravated by bearing weight, palpation and activity. She has tried nothing for the symptoms.  Ankle Pain  Associated symptoms include inability to bear weight and loss of motion. Pertinent negatives include no muscle weakness, no loss of sensation and no tingling.    History reviewed. No pertinent past medical history.  Past Surgical History  Procedure Date  . Cholecystectomy     No family history on file.  History  Substance Use Topics  . Smoking status: Former Games developer  . Smokeless tobacco: Never Used  . Alcohol Use: No    OB History    Grav Para Term Preterm Abortions TAB SAB Ect Mult Living                  Review of Systems  Constitutional: Negative for fever.  Musculoskeletal: Positive for joint swelling.       Left ankle joint pain  Neurological: Negative for tingling.  All other systems reviewed and are negative.    Allergies  Kiwi extract; Other; and Penicillins  Home Medications   Current  Outpatient Rx  Name Route Sig Dispense Refill  . IBUPROFEN 200 MG PO TABS Oral Take 400 mg by mouth every 6 (six) hours as needed. For pain    . NAPROXEN 500 MG PO TABS Oral Take 1 tablet (500 mg total) by mouth 2 (two) times daily. 30 tablet 0    BP 138/93  Pulse 89  Temp 98.9 F (37.2 C) (Oral)  Resp 20  Ht 5\' 2"  (1.575 m)  Wt 230 lb (104.327 kg)  BMI 42.07 kg/m2  SpO2 98%  LMP 05/10/2012  Physical Exam  Constitutional: She is oriented to person, place, and time.       obese  HENT:  Head: Normocephalic.  Eyes: Pupils are equal, round, and reactive to light.  Neck: Normal range of motion. Neck supple.  Cardiovascular: Normal rate, regular rhythm and normal heart sounds.   Pulmonary/Chest: Effort normal and breath sounds normal.  Abdominal: Soft. Bowel sounds are normal.  Musculoskeletal:       Swollen left ankle tender to palpation over ATFL and lateral malleolus, ROM 4/5, unable to test strength 2/2 pain, unreliable anterior drawer 2/2 guarding,   Neurological: She is alert and oriented to person, place, and time.  Skin: Skin is warm and dry.  Psychiatric: She has a normal mood and affect. Her behavior is normal. Judgment and thought content normal.    ED Course  Procedures (including critical care time)  Labs Reviewed - No data to display No results found. Results for orders placed during the hospital encounter of 08/15/11  URINALYSIS, ROUTINE W REFLEX MICROSCOPIC      Component Value Range   Color, Urine YELLOW  YELLOW   APPearance CLEAR  CLEAR   Specific Gravity, Urine 1.015  1.005 - 1.030   pH 5.5  5.0 - 8.0   Glucose, UA NEGATIVE  NEGATIVE mg/dL   Hgb urine dipstick SMALL (*) NEGATIVE   Bilirubin Urine NEGATIVE  NEGATIVE   Ketones, ur NEGATIVE  NEGATIVE mg/dL   Protein, ur NEGATIVE  NEGATIVE mg/dL   Urobilinogen, UA 0.2  0.0 - 1.0 mg/dL   Nitrite NEGATIVE  NEGATIVE   Leukocytes, UA NEGATIVE  NEGATIVE  PREGNANCY, URINE      Component Value Range   Preg  Test, Ur NEGATIVE    URINE MICROSCOPIC-ADD ON      Component Value Range   Squamous Epithelial / LPF RARE  RARE   RBC / HPF 3-6  <3 RBC/hpf   Bacteria, UA RARE  RARE   Dg Ankle Complete Left  05/22/2012  *RADIOLOGY REPORT*  Clinical Data: Left ankle injury.  Pain over lateral malleolus.  LEFT ANKLE COMPLETE - 3+ VIEW  Comparison: Left ankle films 05/03/2010.  Findings: Mild soft tissue swelling is present over the lateral malleolus.  Mild irregularity of the distal fibula is stable.  No acute osseous abnormality is present.  A prominent plantar calcaneal spur is again noted.  IMPRESSION:  1.  Soft tissue swelling of the lateral malleolus without an acute fracture. 2.  Prominent plantar calcaneal spur.   Original Report Authenticated By: Jamesetta Orleans. MATTERN, M.D.      1. Ankle sprain       MDM  36 yo, female presents with ankle pain following a fall today. Unable to weight bear. Fracture ruled out.  Suspect 1st degree ankle sprain.  Will discharge with ACE wrap splint, crutches and ibuprofen.  Instructed patient to weight bear as tolerated and to ice 3 times daily for 20 minutes.        Roxy Horseman, PA-C 05/22/12 1733

## 2012-05-22 NOTE — ED Notes (Signed)
Patient placed in ACE wrap.  Patient also reports walking on crutches previously.

## 2012-10-25 ENCOUNTER — Encounter (HOSPITAL_BASED_OUTPATIENT_CLINIC_OR_DEPARTMENT_OTHER): Payer: Self-pay | Admitting: Emergency Medicine

## 2012-10-25 ENCOUNTER — Emergency Department (HOSPITAL_BASED_OUTPATIENT_CLINIC_OR_DEPARTMENT_OTHER)
Admission: EM | Admit: 2012-10-25 | Discharge: 2012-10-25 | Disposition: A | Discharge status: Home / Self Care | Payer: Self-pay | Attending: Emergency Medicine | Admitting: Emergency Medicine

## 2012-10-25 ENCOUNTER — Emergency Department (HOSPITAL_BASED_OUTPATIENT_CLINIC_OR_DEPARTMENT_OTHER): Payer: Self-pay

## 2012-10-25 DIAGNOSIS — Z87891 Personal history of nicotine dependence: Secondary | ICD-10-CM | POA: Insufficient documentation

## 2012-10-25 DIAGNOSIS — S50311A Abrasion of right elbow, initial encounter: Secondary | ICD-10-CM

## 2012-10-25 DIAGNOSIS — Y929 Unspecified place or not applicable: Secondary | ICD-10-CM | POA: Insufficient documentation

## 2012-10-25 DIAGNOSIS — S5001XA Contusion of right elbow, initial encounter: Secondary | ICD-10-CM

## 2012-10-25 DIAGNOSIS — W108XXA Fall (on) (from) other stairs and steps, initial encounter: Secondary | ICD-10-CM | POA: Insufficient documentation

## 2012-10-25 DIAGNOSIS — S5000XA Contusion of unspecified elbow, initial encounter: Secondary | ICD-10-CM | POA: Insufficient documentation

## 2012-10-25 DIAGNOSIS — IMO0002 Reserved for concepts with insufficient information to code with codable children: Secondary | ICD-10-CM | POA: Insufficient documentation

## 2012-10-25 DIAGNOSIS — Y9301 Activity, walking, marching and hiking: Secondary | ICD-10-CM | POA: Insufficient documentation

## 2012-10-25 MED ORDER — HYDROCODONE-ACETAMINOPHEN 5-325 MG PO TABS: 2.0000 | ORAL_TABLET | ORAL | Status: DC | PRN

## 2012-10-25 MED ORDER — HYDROCODONE-ACETAMINOPHEN 5-325 MG PO TABS
2.0000 | ORAL_TABLET | Freq: Once | ORAL | Status: AC
  Administered 2012-10-25: 2 via ORAL
  Filled 2012-10-25: qty 2

## 2012-10-25 NOTE — ED Provider Notes (Signed)
Medical screening examination/treatment/procedure(s) were performed by non-physician practitioner and as supervising physician I was immediately available for consultation/collaboration.   Siddhant Hashemi, MD 10/25/12 2258 

## 2012-10-25 NOTE — ED Provider Notes (Signed)
History     CSN: [id number]  Arrival date & time 10/25/12  1607   First MD Initiated Contact with Patient 10/25/12 1611      Chief Complaint  Patient presents with  . Elbow Pain    (Consider location/radiation/quality/duration/timing/severity/associated sxs/prior treatment) Patient is a 37 y.o. female presenting with fall. The history is provided by the patient. No language interpreter was used.  Fall The accident occurred 1 to 2 hours ago. The fall occurred while walking. She fell from a height of 1 to 2 ft. She landed on concrete. There was no blood loss. The point of impact was the right elbow. The pain is present in the right elbow. The pain is at a severity of 5/10. The pain is moderate. She was not ambulatory at the scene. There was no entrapment after the fall. There was no drug use involved in the accident. She has tried NSAIDs for the symptoms. The treatment provided mild relief.  Pt reports she fell an hit her elbow.  Pt complains of swelling and pain.   Pain with movement  History reviewed. No pertinent past medical history.  Past Surgical History  Procedure Laterality Date  . Cholecystectomy      No family history on file.  History  Substance Use Topics  . Smoking status: Former Games developer  . Smokeless tobacco: Never Used  . Alcohol Use: No    OB History   Grav Para Term Preterm Abortions TAB SAB Ect Mult Living                  Review of Systems  Musculoskeletal: Positive for joint swelling.  Skin: Positive for wound.  All other systems reviewed and are negative.    Allergies  Kiwi extract; Other; Penicillins; and Amoxicillin  Home Medications   Current Outpatient Rx  Name  Route  Sig  Dispense  Refill  . ibuprofen (ADVIL,MOTRIN) 200 MG tablet   Oral   Take 400 mg by mouth every 6 (six) hours as needed. For pain         . ibuprofen (ADVIL,MOTRIN) 800 MG tablet   Oral   Take 1 tablet (800 mg total) by mouth 3 (three) times daily.   21 tablet  0   . naproxen (NAPROSYN) 500 MG tablet   Oral   Take 1 tablet (500 mg total) by mouth 2 (two) times daily.   30 tablet   0     BP 141/96  Pulse 83  Temp(Src) 98.6 F (37 C) (Oral)  Resp 18  Ht 5\' 1"  (1.549 m)  Wt 230 lb (104.327 kg)  BMI 43.48 kg/m2  SpO2 100%  LMP 10/06/2012  Physical Exam  Nursing note and vitals reviewed. Constitutional: She is oriented to person, place, and time. She appears well-developed.  HENT:  Head: Normocephalic.  Eyes: Pupils are equal, round, and reactive to light.  Musculoskeletal: She exhibits tenderness.  Neurological: She is alert and oriented to person, place, and time. She has normal reflexes.  Skin: Skin is warm.  Abrasion right forearm and elbow    ED Course  Procedures (including critical care time)  Labs Reviewed - No data to display Dg Elbow Complete Right  10/25/2012  *RADIOLOGY REPORT*  Clinical Data: Elbow pain status post fall  RIGHT ELBOW - COMPLETE 3+ VIEW  Comparison: None.  Findings: No acute fracture, malalignment or elbow joint effusion. Osseous mineralization is within normal limits.  No focal soft tissue swelling.  IMPRESSION: Negative radiographs of the  elbow.   Original Report Authenticated By: Malachy Moan, M.D.      No diagnosis found.    MDM  Hydrocodone,   Ice, sling.  Follow up with Dr, Pearletha Forge for recheck in 1 week.        Lonia Skinner [place], [place] 10/25/12 1737

## 2012-10-25 NOTE — ED Notes (Signed)
Tripped going up steps today, fell onto left elbow.

## 2012-12-06 IMAGING — CR DG SHOULDER 2+V*R*
3 series · 3 of 3 positions shown · non-contrast
Comparison: None.

CLINICAL DATA: Status post fall; right shoulder pain.

RIGHT SHOULDER - 2+ VIEW

[w shoulder ap internal righ]
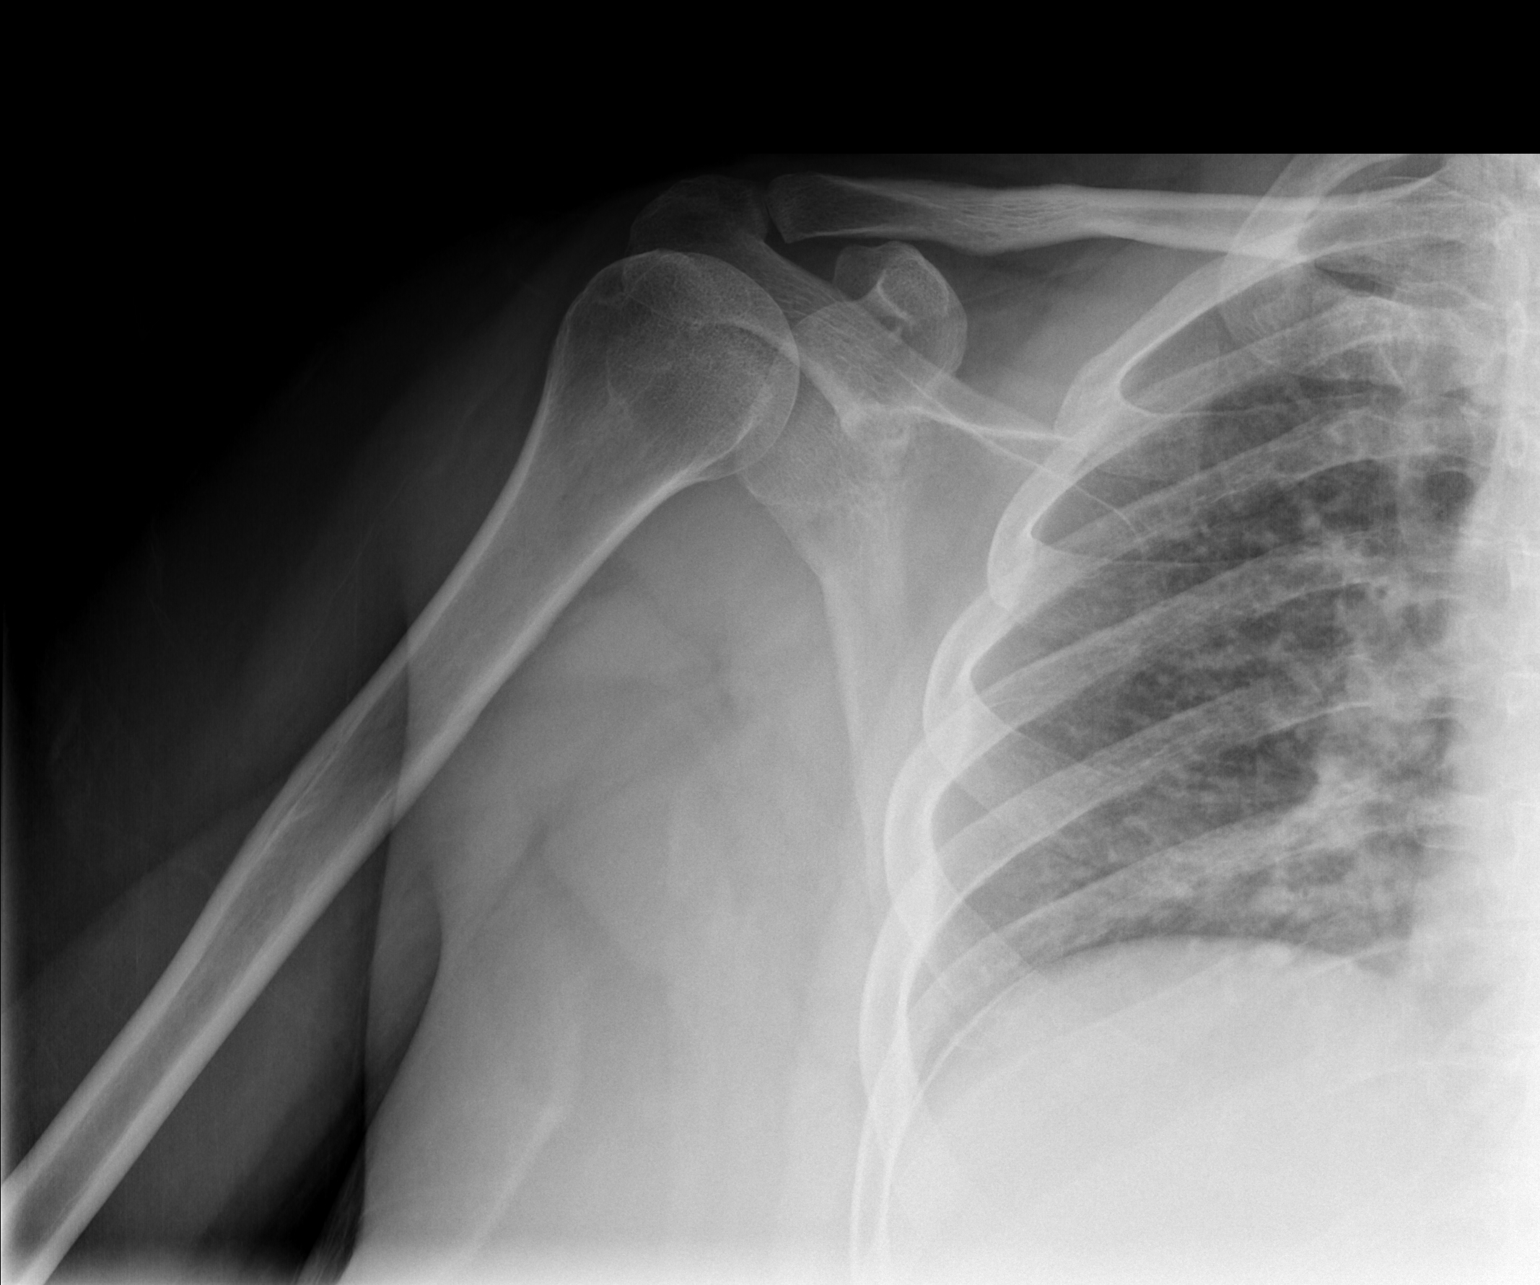

[w shoulder ap external righ]
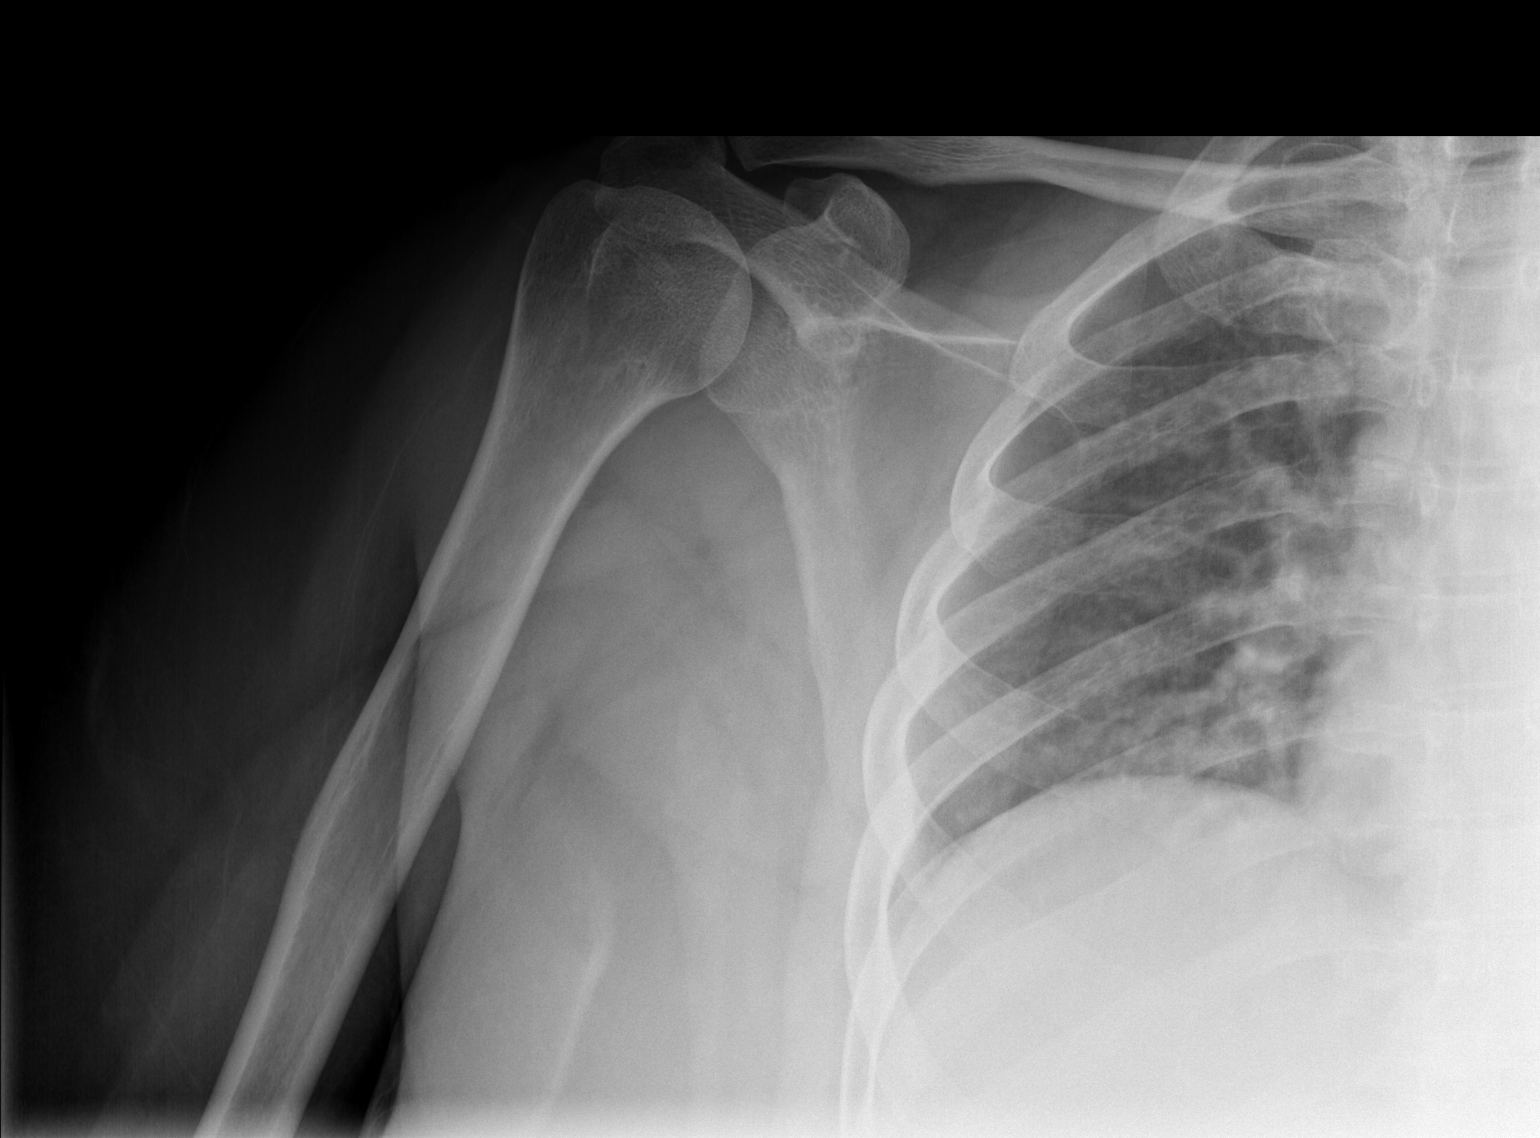

[w shoulder y view right]
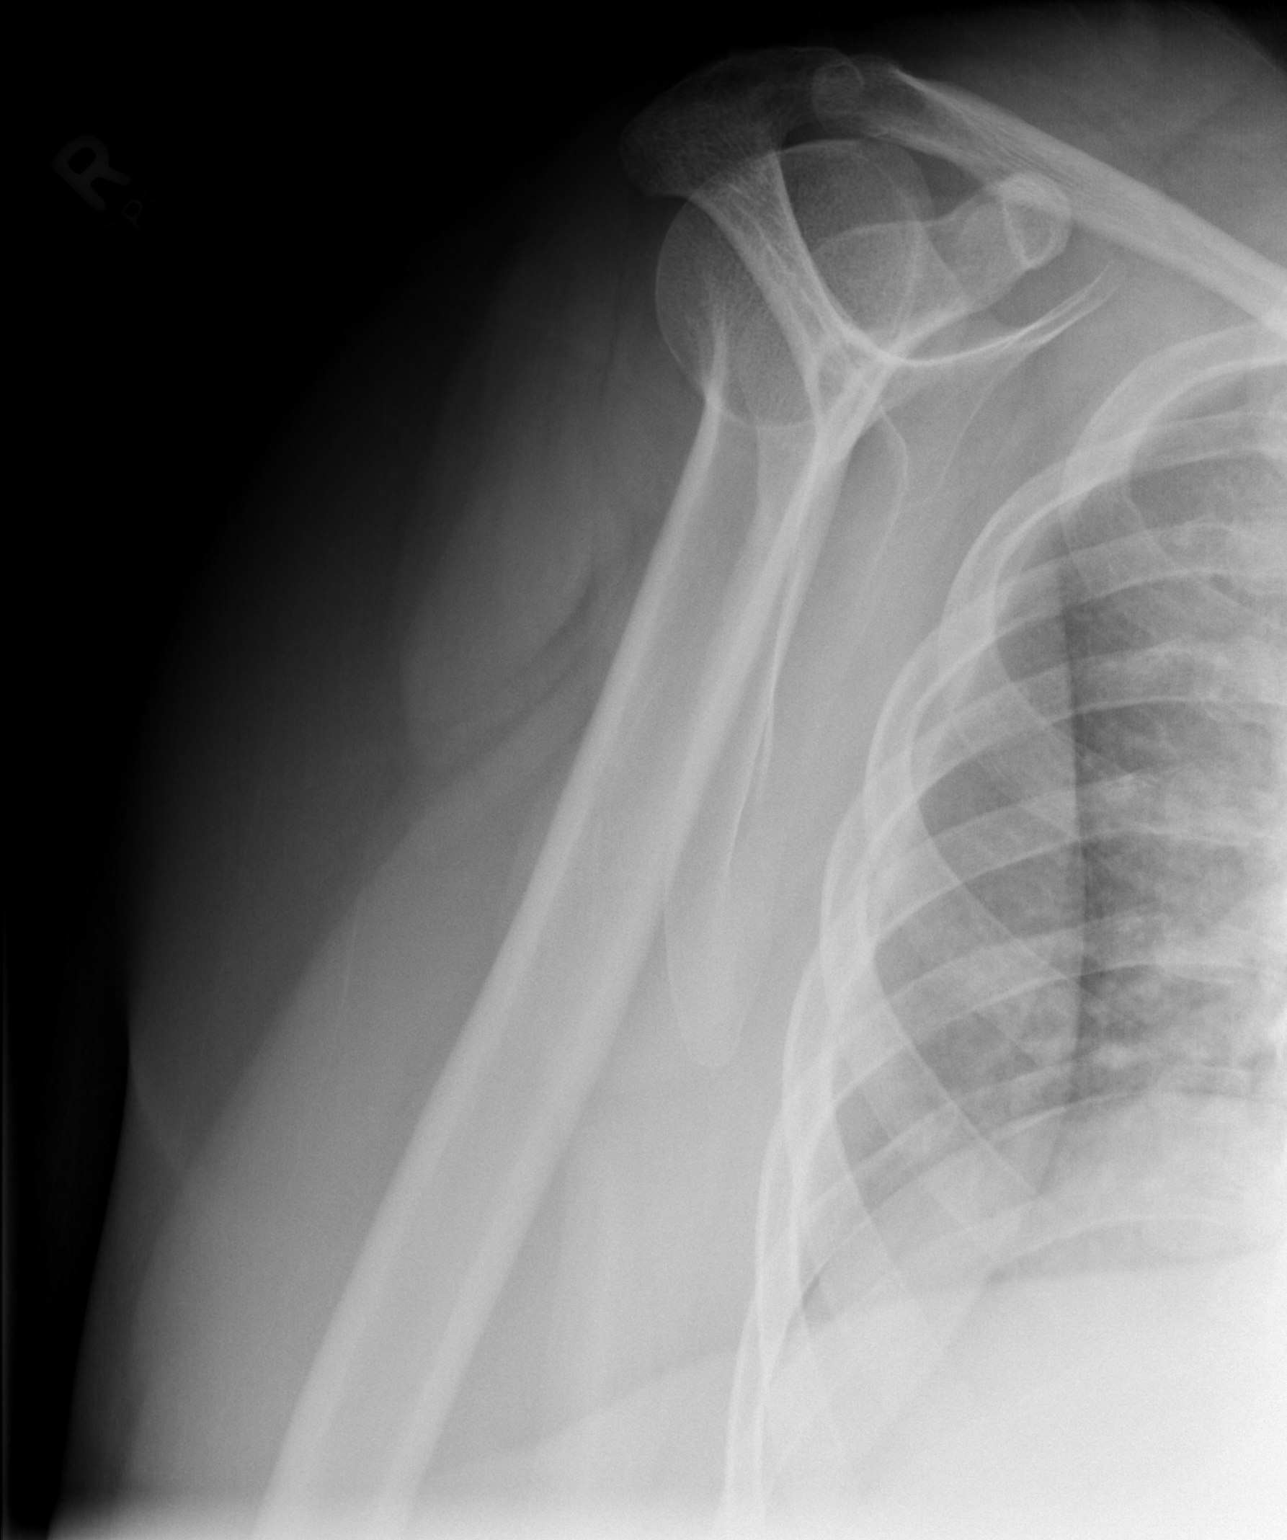

[3 of 3 positions shown; findings below may reference images not displayed]

FINDINGS: There is no evidence of fracture or dislocation.  The
right humeral head is seated within the glenoid fossa.  The
acromioclavicular joint is unremarkable in appearance.  No
significant soft tissue abnormalities are seen.  Mild atelectasis
is noted at the right lung base.
IMPRESSION: No evidence of fracture or dislocation.

## 2012-12-19 ENCOUNTER — Emergency Department (HOSPITAL_BASED_OUTPATIENT_CLINIC_OR_DEPARTMENT_OTHER)
Admission: EM | Admit: 2012-12-19 | Discharge: 2012-12-19 | Disposition: A | Discharge status: Home / Self Care | Payer: Self-pay | Attending: Emergency Medicine | Admitting: Emergency Medicine

## 2012-12-19 ENCOUNTER — Emergency Department (HOSPITAL_BASED_OUTPATIENT_CLINIC_OR_DEPARTMENT_OTHER): Payer: Self-pay

## 2012-12-19 ENCOUNTER — Encounter (HOSPITAL_BASED_OUTPATIENT_CLINIC_OR_DEPARTMENT_OTHER): Payer: Self-pay | Admitting: *Deleted

## 2012-12-19 DIAGNOSIS — S60229A Contusion of unspecified hand, initial encounter: Secondary | ICD-10-CM | POA: Insufficient documentation

## 2012-12-19 DIAGNOSIS — S60221A Contusion of right hand, initial encounter: Secondary | ICD-10-CM

## 2012-12-19 DIAGNOSIS — Y939 Activity, unspecified: Secondary | ICD-10-CM | POA: Insufficient documentation

## 2012-12-19 DIAGNOSIS — Z87891 Personal history of nicotine dependence: Secondary | ICD-10-CM | POA: Insufficient documentation

## 2012-12-19 DIAGNOSIS — W010XXA Fall on same level from slipping, tripping and stumbling without subsequent striking against object, initial encounter: Secondary | ICD-10-CM | POA: Insufficient documentation

## 2012-12-19 DIAGNOSIS — Y92009 Unspecified place in unspecified non-institutional (private) residence as the place of occurrence of the external cause: Secondary | ICD-10-CM | POA: Insufficient documentation

## 2012-12-19 MED ORDER — HYDROCODONE-ACETAMINOPHEN 5-325 MG PO TABS: 2.0000 | ORAL_TABLET | Freq: Four times a day (QID) | ORAL | Status: DC | PRN

## 2012-12-19 MED ORDER — HYDROCODONE-ACETAMINOPHEN 5-325 MG PO TABS
2.0000 | ORAL_TABLET | Freq: Once | ORAL | Status: AC
  Administered 2012-12-19: 2 via ORAL
  Filled 2012-12-19: qty 2

## 2012-12-19 NOTE — ED Provider Notes (Addendum)
History     CSN: [id number]  Arrival date & time 12/19/12  1601   First MD Initiated Contact with Patient 12/19/12 1703      Chief Complaint  Patient presents with  . Hand Injury    (Consider location/radiation/quality/duration/timing/severity/associated sxs/prior treatment) HPI Complains of right hand pain after she tripped and fell at home this afternoon landing on the dorsal aspect of her right hand she complains of pain at the MCP joints of the ring and fifth fingers, dorsal aspect. Pain is worse with movement improved with remaining still. no other associated symptoms no treatment prior to coming here. Pain is moderate. No other associated symptoms. History reviewed. No pertinent past medical history.  Past Surgical History  Procedure Laterality Date  . Cholecystectomy      History reviewed. No pertinent family history.  History  Substance Use Topics  . Smoking status: Former Games developer  . Smokeless tobacco: Never Used  . Alcohol Use: No    OB History   Grav Para Term Preterm Abortions TAB SAB Ect Mult Living                  Review of Systems  Constitutional: Negative.   Musculoskeletal: Positive for arthralgias.       Pain at right hand  Neurological: Negative.     Allergies  Kiwi extract; Other; Penicillins; and Amoxicillin  Home Medications   Current Outpatient Rx  Name  Route  Sig  Dispense  Refill  . HYDROcodone-acetaminophen (NORCO/VICODIN) 5-325 MG per tablet   Oral   Take 2 tablets by mouth every 4 (four) hours as needed for pain.   10 tablet   0   . ibuprofen (ADVIL,MOTRIN) 200 MG tablet   Oral   Take 400 mg by mouth every 6 (six) hours as needed. For pain         . ibuprofen (ADVIL,MOTRIN) 800 MG tablet   Oral   Take 1 tablet (800 mg total) by mouth 3 (three) times daily.   21 tablet   0   . naproxen (NAPROSYN) 500 MG tablet   Oral   Take 1 tablet (500 mg total) by mouth 2 (two) times daily.   30 tablet   0     BP 142/92   Pulse 94  Temp(Src) 98.3 F (36.8 C) (Oral)  Resp 16  Ht 5\' 1"  (1.549 m)  Wt 240 lb (108.863 kg)  BMI 45.37 kg/m2  SpO2 100%  LMP 12/04/2012  Physical Exam  Constitutional: She appears well-developed and well-nourished. No distress.  HENT:  Head: Normocephalic and atraumatic.  Right Ear: External ear normal.  Left Ear: External ear normal.  Eyes: EOM are normal.  Cardiovascular: Normal rate.   Pulmonary/Chest: Effort normal.  Abdominal:  Obese  Musculoskeletal:  Right upper extremity skin intact, ecchymosis at fourth and fifth fingers, proximal phalanges,, palmar aspect. Ecchymotic at dorsum of hand over the fourth and fifth metacarpals. Full range of motion of all fingers no soft tissue swelling. Tender over fourth and fifth MCP joints, dorsal aspect. Radial pulse 2+ all digits with good capillary refill and sensation intact. All other extremities a contusion abrasion or tenderness neurovascularly intact    ED Course  Procedures (including critical care time)  Labs Reviewed - No data to display Dg Hand Complete Right  12/19/2012  *RADIOLOGY REPORT*  Clinical Data: Hand injury post fall  RIGHT HAND - COMPLETE 3+ VIEW  Comparison: None.  Findings: Three views of the right hand submitted.  No acute fracture or subluxation.  No radiopaque foreign body.  IMPRESSION: No acute fracture or subluxation.   Original Report Authenticated By: Natasha Mead, M.D.      No diagnosis found.  X-ray viewed by me  MDM  Plan prescription Norco, ice pack, referral hand surgeon Dr Mina Marble. As needed 4- 5 days To resource guide for primary care physician Diagnosis contusion to right hand        Doug Sou, MD 12/19/12 1728  Doug Sou, MD 12/19/12 1729

## 2012-12-19 NOTE — ED Notes (Signed)
Pt reports hand injury x 1 hr ago

## 2013-04-25 ENCOUNTER — Emergency Department (HOSPITAL_COMMUNITY)
Admission: EM | Admit: 2013-04-25 | Discharge: 2013-04-25 | Disposition: A | Discharge status: Home / Self Care | Payer: Self-pay | Attending: Emergency Medicine | Admitting: Emergency Medicine

## 2013-04-25 ENCOUNTER — Emergency Department (HOSPITAL_COMMUNITY): Payer: Self-pay

## 2013-04-25 ENCOUNTER — Encounter (HOSPITAL_COMMUNITY): Payer: Self-pay | Admitting: *Deleted

## 2013-04-25 DIAGNOSIS — S6990XA Unspecified injury of unspecified wrist, hand and finger(s), initial encounter: Secondary | ICD-10-CM | POA: Insufficient documentation

## 2013-04-25 DIAGNOSIS — Y929 Unspecified place or not applicable: Secondary | ICD-10-CM | POA: Insufficient documentation

## 2013-04-25 DIAGNOSIS — Z880 Allergy status to penicillin: Secondary | ICD-10-CM | POA: Insufficient documentation

## 2013-04-25 DIAGNOSIS — R296 Repeated falls: Secondary | ICD-10-CM | POA: Insufficient documentation

## 2013-04-25 DIAGNOSIS — Y9301 Activity, walking, marching and hiking: Secondary | ICD-10-CM | POA: Insufficient documentation

## 2013-04-25 DIAGNOSIS — S6992XA Unspecified injury of left wrist, hand and finger(s), initial encounter: Secondary | ICD-10-CM

## 2013-04-25 DIAGNOSIS — Z87891 Personal history of nicotine dependence: Secondary | ICD-10-CM | POA: Insufficient documentation

## 2013-04-25 MED ORDER — HYDROCODONE-ACETAMINOPHEN 5-325 MG PO TABS
2.0000 | ORAL_TABLET | Freq: Once | ORAL | Status: AC
  Administered 2013-04-25: 2 via ORAL
  Filled 2013-04-25: qty 2

## 2013-04-25 MED ORDER — HYDROCODONE-ACETAMINOPHEN 5-325 MG PO TABS: 1.0000 | ORAL_TABLET | Freq: Four times a day (QID) | ORAL | Status: DC | PRN

## 2013-04-25 NOTE — Progress Notes (Signed)
CM spoke with pt who confirms self pay [place] resident with no pcp. CM discussed and provided written information for self pay pcps, importance of pcp for f/u care, www.needymeds.org, discounted pharmacies and other [place] resources such as financial assistance, DSS and  health department  Reviewed resources for [place] self pay pcps like [facility], [facility] [facility], [facility], general medical clinics, [facility] plus others, [facility] out patient pharmacies and housing Pt voiced understanding and appreciation of resources provided     

## 2013-04-25 NOTE — ED Notes (Signed)
Pt states was in a ditch picking up trash when she fell and landed on L hand, complaining of L hand/knuckle pain.

## 2013-04-25 NOTE — ED Provider Notes (Signed)
CSN: [id number]     Arrival date & time 04/25/13  1502 History    This chart was scribed for Magnus Sinning, PA, working with Gerhard Munch, MD by Blanchard Kelch, ED Scribe. This patient was seen in room WTR7/WTR7 and the patient's care was started at 3:54 PM.     Chief Complaint  Patient presents with  . Hand Injury    Patient is a 37 y.o. female presenting with hand injury. The history is provided by the patient.  Hand Injury Location:  Hand and wrist Time since incident:  2 hours Injury: yes   Mechanism of injury: fall   Fall:    Fall occurred:  Walking   Impact surface:  Designer, fashion/clothing of impact:  Hands (left hand)   Entrapped after fall: no   Wrist location:  L wrist Hand location:  L hand Pain details:    Quality:  Tingling   Radiates to:  Does not radiate   Severity:  Moderate   Onset quality:  Sudden   Timing:  Constant   Progression:  Unchanged Chronicity:  New Dislocation: no   Foreign body present:  No foreign bodies Prior injury to area:  No Relieved by:  Nothing Worsened by:  Movement Ineffective treatments:  Ice Associated symptoms: decreased range of motion, swelling and tingling   Associated symptoms: no back pain, no neck pain and no numbness     HPI Comments: [name] is a 37 y.o. female who presents to the Emergency Department complaining of a left hand and wrist injury that occurred 1.5 hours ago while she was helping her spouse outside. There is associated constant, moderate pain, intermittent tingling and mild swelling. Patient denies feelings of numbness. The pain is worsened with motion and palpation. Patient used ice to reduce swelling but has not tried any pain medications. Patient denies head injury, loss of consciousness, neck pain or back pain. Patient has no history of previous hand/wrist injury.    No past medical history on file. Past Surgical History  Procedure Laterality Date  . Cholecystectomy     No family history on  file. History  Substance Use Topics  . Smoking status: Former Games developer  . Smokeless tobacco: Never Used  . Alcohol Use: No   OB History   Grav Para Term Preterm Abortions TAB SAB Ect Mult Living                 Review of Systems  HENT: Negative for neck pain.   Musculoskeletal: Negative for back pain.    Allergies  Kiwi extract; Other; Penicillins; and Amoxicillin  Home Medications   Current Outpatient Rx  Name  Route  Sig  Dispense  Refill  . HYDROcodone-acetaminophen (NORCO/VICODIN) 5-325 MG per tablet   Oral   Take 2 tablets by mouth every 4 (four) hours as needed for pain.   10 tablet   0   . HYDROcodone-acetaminophen (NORCO/VICODIN) 5-325 MG per tablet   Oral   Take 2 tablets by mouth every 6 (six) hours as needed for pain.   8 tablet   0   . ibuprofen (ADVIL,MOTRIN) 200 MG tablet   Oral   Take 400 mg by mouth every 6 (six) hours as needed. For pain         . ibuprofen (ADVIL,MOTRIN) 800 MG tablet   Oral   Take 1 tablet (800 mg total) by mouth 3 (three) times daily.   21 tablet   0  Triage Vitals: BP 123/91  Pulse 85  Temp(Src) 98.4 F (36.9 C) (Oral)  Resp 20  SpO2 100%  LMP 04/05/2013  Physical Exam  Nursing note and vitals reviewed. Constitutional: She is oriented to person, place, and time. She appears well-developed and well-nourished. No distress.  HENT:  Head: Normocephalic and atraumatic.  Eyes: Conjunctivae and EOM are normal.  Neck: Normal range of motion. Neck supple.  Cardiovascular: Normal rate, regular rhythm and normal heart sounds.   Pulses:      Radial pulses are 2+ on the right side, and 2+ on the left side.  Pulmonary/Chest: Effort normal and breath sounds normal. No respiratory distress.  Musculoskeletal:   Tenderness upon palpation on 2nd,3rd,and 4th metacarpals. Mild tenderness to palpation and mild pain with ROM of left wrist. ROM of third digit (left hand) limited  Neurological: She is alert and oriented to person,  place, and time.  Distal sensation intact of all fingers of the left hand.  Skin: Skin is warm and dry. She is not diaphoretic.   Good capillary refill of all fingers on left hand.  Psychiatric: She has a normal mood and affect.    ED Course  DIAGNOSTIC STUDIES: Oxygen Saturation is 100% on room air, normal by my interpretation.    COORDINATION OF CARE:  3:58 PM - Will order hand x-ray. Patient will be given ice and pain medication. Patient verbalizes understanding and agrees with treatment plan.   Procedures (including critical care time)  Labs Reviewed - No data to display Dg Wrist Complete Left  04/25/2013   *RADIOLOGY REPORT*  Clinical Data: Status post fall.  Left hand and wrist pain.  LEFT WRIST - COMPLETE 3+ VIEW  Comparison: None.  Findings: Imaged bones, joints and soft tissues appear normal.  IMPRESSION: Negative exam.   Original Report Authenticated By: Holley Dexter, M.D.   Dg Hand Complete Left  04/25/2013   *RADIOLOGY REPORT*  Clinical Data: Status post fall.  Left hand and wrist pain.  LEFT HAND - COMPLETE 3+ VIEW  Comparison: None.  Findings: Imaged bones, joints and soft tissues appear normal.  IMPRESSION: Normal study.   Original Report Authenticated By: Holley Dexter, M.D.   No diagnosis found.   MDM  Patient presenting with left hand and left wrist pain after falling in a ditch earlier today.  Xrays negative.  Patient is neurovascularly intact.  Feel that the patient is stable for discharge.  Return precautions given.  I personally performed the services described in this documentation, which was scribed in my presence. The recorded information has been reviewed and is accurate.    Pascal Lux [place], PA-C 04/26/13 [phone]

## 2013-04-26 NOTE — ED Provider Notes (Signed)
  Medical screening examination/treatment/procedure(s) were performed by non-physician practitioner and as supervising physician I was immediately available for consultation/collaboration.    Gerhard Munch, MD 04/26/13 [phone]

## 2013-05-06 ENCOUNTER — Emergency Department (HOSPITAL_BASED_OUTPATIENT_CLINIC_OR_DEPARTMENT_OTHER)
Admission: EM | Admit: 2013-05-06 | Discharge: 2013-05-06 | Disposition: A | Discharge status: Home / Self Care | Payer: Self-pay | Attending: Emergency Medicine | Admitting: Emergency Medicine

## 2013-05-06 ENCOUNTER — Encounter (HOSPITAL_BASED_OUTPATIENT_CLINIC_OR_DEPARTMENT_OTHER): Payer: Self-pay | Admitting: *Deleted

## 2013-05-06 DIAGNOSIS — N61 Mastitis without abscess: Secondary | ICD-10-CM | POA: Insufficient documentation

## 2013-05-06 DIAGNOSIS — Z880 Allergy status to penicillin: Secondary | ICD-10-CM | POA: Insufficient documentation

## 2013-05-06 DIAGNOSIS — R6883 Chills (without fever): Secondary | ICD-10-CM | POA: Insufficient documentation

## 2013-05-06 DIAGNOSIS — N611 Abscess of the breast and nipple: Secondary | ICD-10-CM

## 2013-05-06 DIAGNOSIS — Z87891 Personal history of nicotine dependence: Secondary | ICD-10-CM | POA: Insufficient documentation

## 2013-05-06 MED ORDER — TRAMADOL HCL 50 MG PO TABS
50.0000 mg | ORAL_TABLET | Freq: Once | ORAL | Status: AC
  Administered 2013-05-06: 50 mg via ORAL
  Filled 2013-05-06: qty 1

## 2013-05-06 MED ORDER — DOXYCYCLINE HYCLATE 100 MG PO TABS: 100.0000 mg | ORAL_TABLET | Freq: Two times a day (BID) | ORAL | Status: DC

## 2013-05-06 MED ORDER — DOXYCYCLINE HYCLATE 100 MG PO TABS
100.0000 mg | ORAL_TABLET | Freq: Once | ORAL | Status: AC
  Administered 2013-05-06: 100 mg via ORAL
  Filled 2013-05-06: qty 1

## 2013-05-06 MED ORDER — LIDOCAINE HCL (PF) 1 % IJ SOLN
5.0000 mL | Freq: Once | INTRAMUSCULAR | Status: AC
  Administered 2013-05-06: 5 mL
  Filled 2013-05-06: qty 5

## 2013-05-06 MED ORDER — HYDROCODONE-ACETAMINOPHEN 5-325 MG PO TABS: 1.0000 | ORAL_TABLET | Freq: Four times a day (QID) | ORAL | Status: DC | PRN

## 2013-05-06 NOTE — ED Notes (Signed)
Abscess to the side of her left breast.

## 2013-05-06 NOTE — ED Notes (Signed)
I & D tray is at the bedside set up and ready for the doctor to use. 

## 2013-05-06 NOTE — ED Provider Notes (Signed)
Medical screening examination/treatment/procedure(s) were performed by non-physician practitioner and as supervising physician I was immediately available for consultation/collaboration.    Vida Roller, MD 05/06/13 2245

## 2013-05-06 NOTE — ED Provider Notes (Signed)
CSN: [id number]     Arrival date & time 05/06/13  1655 History   First MD Initiated Contact with Patient 05/06/13 1715     Chief Complaint  Patient presents with  . Abscess   (Consider location/radiation/quality/duration/timing/severity/associated sxs/prior Treatment) HPI Comments: Patient with with left lateral breast abscess about 1cm x 2 cm with induration and mild fluctuance.  She reports no drainage but increased pain - denies fever but reports "chills" - denies nausea or vomiting  Patient is a 37 y.o. female presenting with abscess. The history is provided by the patient. No language interpreter was used.  Abscess Location:  Torso Torso abscess location:  L chest Abscess quality: fluctuance, induration, painful and redness   Abscess quality: not draining and not weeping   Red streaking: no   Duration:  1 day Progression:  Worsening Pain details:    Quality:  Aching   Severity:  Moderate   Timing:  Constant   Progression:  Worsening Chronicity:  New Context: not diabetes, not immunosuppression, not injected drug use, not insect bite/sting and not skin injury   Relieved by:  Nothing Worsened by:  Nothing tried Ineffective treatments:  None tried Associated symptoms: no fever     History reviewed. No pertinent past medical history. Past Surgical History  Procedure Laterality Date  . Cholecystectomy     No family history on file. History  Substance Use Topics  . Smoking status: Former Games developer  . Smokeless tobacco: Never Used  . Alcohol Use: No   OB History   Grav Para Term Preterm Abortions TAB SAB Ect Mult Living                 Review of Systems  Constitutional: Negative for fever and chills.  HENT: Negative for ear pain.   Eyes: Negative for pain.  Respiratory: Negative for chest tightness and shortness of breath.   Gastrointestinal: Negative for abdominal pain.  Genitourinary: Negative for pelvic pain.  Skin: Positive for color change and wound.  All other  systems reviewed and are negative.    Allergies  Kiwi extract; Other; Penicillins; and Amoxicillin  Home Medications   Current Outpatient Rx  Name  Route  Sig  Dispense  Refill  . HYDROcodone-acetaminophen (NORCO/VICODIN) 5-325 MG per tablet   Oral   Take 1-2 tablets by mouth every 6 (six) hours as needed for pain.   15 tablet   0   . ibuprofen (ADVIL,MOTRIN) 200 MG tablet   Oral   Take 400 mg by mouth every 6 (six) hours as needed. For pain          BP 132/80  Pulse 80  Temp(Src) 97.7 F (36.5 C) (Oral)  Resp 20  Ht 5\' 2"  (1.575 m)  Wt 250 lb (113.399 kg)  BMI 45.71 kg/m2  SpO2 99%  LMP 04/05/2013 Physical Exam  Nursing note and vitals reviewed. Constitutional: She is oriented to person, place, and time. She appears well-developed and well-nourished. No distress.  HENT:  Head: Normocephalic and atraumatic.  Right Ear: External ear normal.  Left Ear: External ear normal.  Nose: Nose normal.  Mouth/Throat: Oropharynx is clear and moist. No oropharyngeal exudate.  Eyes: Conjunctivae are normal. Pupils are equal, round, and reactive to light. No scleral icterus.  Neck: Normal range of motion. Neck supple.  Cardiovascular: Normal rate, regular rhythm and normal heart sounds.  Exam reveals no gallop and no friction rub.   No murmur heard. Pulmonary/Chest: Effort normal and breath sounds normal. No respiratory  distress. She has no wheezes. She has no rales. She exhibits tenderness.  1cm x 2 cm induration to left breast with small amount of fluctuance.  Abdominal: Soft. Bowel sounds are normal. She exhibits no distension. There is no tenderness.  Musculoskeletal: Normal range of motion. She exhibits no edema and no tenderness.  Neurological: She is alert and oriented to person, place, and time. No cranial nerve deficit.  Skin: Skin is warm and dry. No rash noted. There is erythema.  Psychiatric: She has a normal mood and affect. Her behavior is normal. Judgment and  thought content normal.    ED Course  Procedures (including critical care time) Labs Review Labs Reviewed - No data to display Imaging Review No results found. INCISION AND DRAINAGE Performed by: Cherrie Distance C. Consent: Verbal consent obtained. Risks and benefits: risks, benefits and alternatives were discussed Type: abscess  Body area: left lateral breast  Anesthesia: local infiltration  Incision was made with a scalpel.  Local anesthetic: lidocaine 2% without epinephrine  Anesthetic total: 5 ml  Complexity: complex Blunt dissection to break up loculations  Drainage: purulent  Drainage amount: small  Packing material: 1/4 in iodoform gauze  Patient tolerance: Patient tolerated the procedure well with no immediate complications.   MDM  Left Breast abscess  Patient with uncomplicated left breast abscess - will treat with abx and short course pain medicaiton - packing to be left in for 2 days then may return for pacing removal.   Scarlette Calico C. Marisue Humble, PA-C 05/06/13 1819

## 2013-05-08 ENCOUNTER — Emergency Department (HOSPITAL_BASED_OUTPATIENT_CLINIC_OR_DEPARTMENT_OTHER)
Admission: EM | Admit: 2013-05-08 | Discharge: 2013-05-08 | Disposition: A | Discharge status: Home / Self Care | Payer: Self-pay | Attending: Emergency Medicine | Admitting: Emergency Medicine

## 2013-05-08 ENCOUNTER — Encounter (HOSPITAL_BASED_OUTPATIENT_CLINIC_OR_DEPARTMENT_OTHER): Payer: Self-pay

## 2013-05-08 DIAGNOSIS — Z792 Long term (current) use of antibiotics: Secondary | ICD-10-CM | POA: Insufficient documentation

## 2013-05-08 DIAGNOSIS — Z880 Allergy status to penicillin: Secondary | ICD-10-CM | POA: Insufficient documentation

## 2013-05-08 DIAGNOSIS — Z4800 Encounter for change or removal of nonsurgical wound dressing: Secondary | ICD-10-CM | POA: Insufficient documentation

## 2013-05-08 DIAGNOSIS — Z5189 Encounter for other specified aftercare: Secondary | ICD-10-CM

## 2013-05-08 DIAGNOSIS — Z87891 Personal history of nicotine dependence: Secondary | ICD-10-CM | POA: Insufficient documentation

## 2013-05-08 NOTE — ED Notes (Signed)
Pt here for packing removal.  Seen here 2 days ago and had an I&D left rib area.

## 2013-05-08 NOTE — ED Provider Notes (Signed)
CSN: [id number]     Arrival date & time 05/08/13  1052 History   First MD Initiated Contact with Patient 05/08/13 1200     Chief Complaint  Patient presents with  . Wound Check   (Consider location/radiation/quality/duration/timing/severity/associated sxs/prior Treatment) Patient is a 37 y.o. female presenting with wound check. The history is provided by the patient. No language interpreter was used.  Wound Check This is a recurrent problem. Episode onset: packed 2 days ago. The problem occurs constantly. The problem has been gradually worsening. Nothing aggravates the symptoms. She has tried nothing for the symptoms. The treatment provided no relief.    History reviewed. No pertinent past medical history. Past Surgical History  Procedure Laterality Date  . Cholecystectomy     No family history on file. History  Substance Use Topics  . Smoking status: Former Games developer  . Smokeless tobacco: Never Used  . Alcohol Use: No   OB History   Grav Para Term Preterm Abortions TAB SAB Ect Mult Living                 Review of Systems  Skin: Positive for wound.  All other systems reviewed and are negative.    Allergies  Kiwi extract; Other; Penicillins; and Amoxicillin  Home Medications   Current Outpatient Rx  Name  Route  Sig  Dispense  Refill  . doxycycline (VIBRA-TABS) 100 MG tablet   Oral   Take 1 tablet (100 mg total) by mouth 2 (two) times daily.   20 tablet   0   . HYDROcodone-acetaminophen (NORCO/VICODIN) 5-325 MG per tablet   Oral   Take 1-2 tablets by mouth every 6 (six) hours as needed for pain.   15 tablet   0   . ibuprofen (ADVIL,MOTRIN) 200 MG tablet   Oral   Take 400 mg by mouth every 6 (six) hours as needed. For pain          BP 128/76  Pulse 79  Temp(Src) 98.2 F (36.8 C) (Oral)  Resp 18  Ht 5\' 1"  (1.549 m)  Wt 250 lb (113.399 kg)  BMI 47.26 kg/m2  SpO2 100%  LMP 04/05/2013 Physical Exam  Nursing note and vitals reviewed. Constitutional: She  is oriented to person, place, and time. She appears well-developed and well-nourished.  Musculoskeletal: Normal range of motion. She exhibits tenderness.  Packing left lateral chest  Neurological: She is alert and oriented to person, place, and time. She has normal reflexes.  Skin: Skin is warm.  Psychiatric: She has a normal mood and affect.    ED Course  Procedures (including critical care time) Labs Review Labs Reviewed - No data to display Imaging Review No results found.  MDM   1. Wound check, abscess    Continue antibiotics   Elson Areas, PA-C 05/08/13 1301

## 2013-05-08 NOTE — ED Provider Notes (Signed)
Medical screening examination/treatment/procedure(s) were performed by non-physician practitioner and as supervising physician I was immediately available for consultation/collaboration.  Geoffery Lyons, MD 05/08/13 [phone]

## 2013-06-06 ENCOUNTER — Emergency Department (HOSPITAL_COMMUNITY)
Admission: EM | Admit: 2013-06-06 | Discharge: 2013-06-06 | Disposition: A | Discharge status: Home / Self Care | Payer: Self-pay | Attending: Emergency Medicine | Admitting: Emergency Medicine

## 2013-06-06 ENCOUNTER — Emergency Department (HOSPITAL_COMMUNITY): Payer: Self-pay

## 2013-06-06 ENCOUNTER — Encounter (HOSPITAL_COMMUNITY): Payer: Self-pay | Admitting: Emergency Medicine

## 2013-06-06 DIAGNOSIS — Y9289 Other specified places as the place of occurrence of the external cause: Secondary | ICD-10-CM | POA: Insufficient documentation

## 2013-06-06 DIAGNOSIS — W010XXA Fall on same level from slipping, tripping and stumbling without subsequent striking against object, initial encounter: Secondary | ICD-10-CM | POA: Insufficient documentation

## 2013-06-06 DIAGNOSIS — S8000XA Contusion of unspecified knee, initial encounter: Secondary | ICD-10-CM | POA: Insufficient documentation

## 2013-06-06 DIAGNOSIS — S8001XA Contusion of right knee, initial encounter: Secondary | ICD-10-CM

## 2013-06-06 DIAGNOSIS — Y9389 Activity, other specified: Secondary | ICD-10-CM | POA: Insufficient documentation

## 2013-06-06 DIAGNOSIS — Z880 Allergy status to penicillin: Secondary | ICD-10-CM | POA: Insufficient documentation

## 2013-06-06 DIAGNOSIS — Z87891 Personal history of nicotine dependence: Secondary | ICD-10-CM | POA: Insufficient documentation

## 2013-06-06 MED ORDER — NAPROXEN 500 MG PO TABS
500.0000 mg | ORAL_TABLET | Freq: Once | ORAL | Status: AC
  Administered 2013-06-06: 500 mg via ORAL
  Filled 2013-06-06: qty 1

## 2013-06-06 NOTE — ED Provider Notes (Signed)
Medical screening examination/treatment/procedure(s) were performed by non-physician practitioner and as supervising physician I was immediately available for consultation/collaboration. Devoria Albe, MD, Armando Gang   Ward Givens, MD 06/06/13 2224

## 2013-06-06 NOTE — ED Notes (Signed)
Pt states she tripped over the area rug and fell on her right knee  Pt states she has a knot that came up and continues to get bigger  Pt states it hurts to bear weight on it  Pt states it happened about 2.5 hrs ago

## 2013-06-06 NOTE — ED Provider Notes (Signed)
CSN: [id number]     Arrival date & time 06/06/13  2048 History  This chart was scribed for non-physician practitioner Johnnette Gourd, PA-C, working with Ward Givens, MD by Ronal Fear, ED scribe. This patient was seen in room [facility] and the patient's care was started at 9:35 PM.    No chief complaint on file.  The history is provided by the patient. No language interpreter was used.  HPI Comments: [name] is a 37 y.o. female who presents to the Emergency Department after catching her foot under her rug and falling straight down on right her knee with associated 8/10 pain, and swelling. Pt states that standing worsens the pain. pt denies hitting her head, or injuring any other area, back pain, hip pain, and ankle pain. She does not seem to be in any acute distress, with no other complaints.   No past medical history on file. Past Surgical History  Procedure Laterality Date  . Cholecystectomy     No family history on file. History  Substance Use Topics  . Smoking status: Former Games developer  . Smokeless tobacco: Never Used  . Alcohol Use: No   OB History   Grav Para Term Preterm Abortions TAB SAB Ect Mult Living                 Review of Systems  Cardiovascular: Negative for leg swelling.  Musculoskeletal: Positive for joint swelling and arthralgias. Negative for back pain.  All other systems reviewed and are negative.    Allergies  Amoxicillin; Kiwi extract; Other; and Penicillins  Home Medications   Current Outpatient Rx  Name  Route  Sig  Dispense  Refill  . ibuprofen (ADVIL,MOTRIN) 200 MG tablet   Oral   Take 400 mg by mouth every 6 (six) hours as needed. For pain          BP 119/92  Pulse 70  Temp(Src) 98.2 F (36.8 C) (Oral)  Resp 15  Ht 5\' 1"  (1.549 m)  Wt 250 lb (113.399 kg)  BMI 47.26 kg/m2  SpO2 98% Physical Exam  Nursing note and vitals reviewed. Constitutional: She is oriented to person, place, and time. She appears well-developed and  well-nourished. No distress.  HENT:  Head: Normocephalic and atraumatic.  Mouth/Throat: Oropharynx is clear and moist.  Eyes: Conjunctivae and EOM are normal.  Neck: Normal range of motion. Neck supple.  Cardiovascular: Normal rate, regular rhythm and normal heart sounds.   Pulmonary/Chest: Effort normal and breath sounds normal. No respiratory distress.  Musculoskeletal: Normal range of motion. She exhibits edema and tenderness.  Tender to palpation over the right knee, more so over the patella. With bruising and mild swelling, full ROM of right knee and ankle. Intact distal pulses.  Neurological: She is alert and oriented to person, place, and time. No sensory deficit.  Skin: Skin is warm and dry.  Psychiatric: She has a normal mood and affect. Her behavior is normal.    ED Course  Procedures (including critical care time)  DIAGNOSTIC STUDIES: Oxygen Saturation is 98% on RA, normal by my interpretation.    COORDINATION OF CARE: 9:45 PM- Pt advised of plan for treatment including x-ray and anti inflammatory medication and pt agrees.      Labs Review Labs Reviewed - No data to display Imaging Review Dg Knee Complete 4 Views Right  06/06/2013   *RADIOLOGY REPORT*  Clinical Data: Right knee pain  RIGHT KNEE - COMPLETE 4+ VIEW  Comparison: 12/24/2011  Findings: No  fracture or dislocation.  Mild narrowing of the medial compartment.  Mild degenerative change of the patellofemoral compartment.  A tiny loose body is appreciated within the central joint.  IMPRESSION: Degenerative changes.   Original Report Authenticated By: Esperanza Heir, M.D.    MDM   1. Knee contusion, right, initial encounter    Patient with knee injury after tripping and falling. X-ray without any acute abnormality. No deformity noted. Small area of bruising present. She is well appearing and in no apparent distress. Discharged with Ace wrap, RICE instructions. NSAIDs for pain. She has crutches at home. Patient  states understanding of plan and is agreeable.  I personally performed the services described in this documentation, which was scribed in my presence. The recorded information has been reviewed and is accurate.   Trevor Mace, PA-C 06/06/13 2212

## 2013-07-03 ENCOUNTER — Emergency Department (HOSPITAL_BASED_OUTPATIENT_CLINIC_OR_DEPARTMENT_OTHER): Payer: Self-pay

## 2013-07-03 ENCOUNTER — Encounter (HOSPITAL_BASED_OUTPATIENT_CLINIC_OR_DEPARTMENT_OTHER): Payer: Self-pay | Admitting: Emergency Medicine

## 2013-07-03 ENCOUNTER — Emergency Department (HOSPITAL_BASED_OUTPATIENT_CLINIC_OR_DEPARTMENT_OTHER)
Admission: EM | Admit: 2013-07-03 | Discharge: 2013-07-03 | Disposition: A | Discharge status: Home / Self Care | Payer: Self-pay | Attending: Emergency Medicine | Admitting: Emergency Medicine

## 2013-07-03 DIAGNOSIS — R0982 Postnasal drip: Secondary | ICD-10-CM | POA: Insufficient documentation

## 2013-07-03 DIAGNOSIS — Z880 Allergy status to penicillin: Secondary | ICD-10-CM | POA: Insufficient documentation

## 2013-07-03 DIAGNOSIS — J40 Bronchitis, not specified as acute or chronic: Secondary | ICD-10-CM | POA: Insufficient documentation

## 2013-07-03 DIAGNOSIS — Z87891 Personal history of nicotine dependence: Secondary | ICD-10-CM | POA: Insufficient documentation

## 2013-07-03 MED ORDER — HYDROCODONE-HOMATROPINE 5-1.5 MG/5ML PO SYRP: 5.0000 mL | ORAL_SOLUTION | Freq: Four times a day (QID) | ORAL | Status: DC | PRN

## 2013-07-03 MED ORDER — ALBUTEROL SULFATE HFA 108 (90 BASE) MCG/ACT IN AERS
2.0000 | INHALATION_SPRAY | Freq: Once | RESPIRATORY_TRACT | Status: AC
  Administered 2013-07-03: 2 via RESPIRATORY_TRACT
  Filled 2013-07-03: qty 6.7

## 2013-07-03 MED ORDER — PREDNISONE 20 MG PO TABS: ORAL_TABLET | ORAL | Status: AC

## 2013-07-03 NOTE — ED Notes (Signed)
Pt c/o URi symptoms and cough x 1 week  

## 2013-07-03 NOTE — ED Provider Notes (Signed)
CSN: [id number]     Arrival date & time 07/03/13  1616 History   First MD Initiated Contact with Patient 07/03/13 1628     Chief Complaint  Patient presents with  . URI   (Consider location/radiation/quality/duration/timing/severity/associated sxs/prior Treatment) HPI Comments: Patient presents with cough and nasal congestion. She complains of a one-week history of runny nose congestion and sore throat and cough. Her cough is mostly nonproductive. She denies any fevers or chills. She denies he nausea vomiting or ongoing diarrhea. She denies any history of underlying lung disease. She denies any current smoking. She denies a shortness of breath. She's been using over-the-counter medications without relief. She complains of soreness across both sides of her ribs from coughing.  Patient is a 37 y.o. female presenting with URI.  URI Presenting symptoms: congestion, cough, rhinorrhea and sore throat   Presenting symptoms: no fatigue and no fever   Associated symptoms: no arthralgias, no headaches and no sneezing     History reviewed. No pertinent past medical history. Past Surgical History  Procedure Laterality Date  . Cholecystectomy     Family History  Problem Relation Age of Onset  . Hypertension Other   . Diabetes Other   . COPD Other    History  Substance Use Topics  . Smoking status: Former Games developer  . Smokeless tobacco: Never Used  . Alcohol Use: No   OB History   Grav Para Term Preterm Abortions TAB SAB Ect Mult Living                 Review of Systems  Constitutional: Negative for fever, chills, diaphoresis and fatigue.  HENT: Positive for congestion, postnasal drip, rhinorrhea, sinus pressure and sore throat. Negative for sneezing.   Eyes: Negative.   Respiratory: Positive for cough. Negative for chest tightness and shortness of breath.   Cardiovascular: Positive for chest pain (soreness to ribs). Negative for leg swelling.  Gastrointestinal: Negative for nausea,  vomiting, abdominal pain, diarrhea and blood in stool.  Genitourinary: Negative for frequency, hematuria, flank pain and difficulty urinating.  Musculoskeletal: Negative for arthralgias and back pain.  Skin: Negative for rash.  Neurological: Negative for dizziness, speech difficulty, weakness, numbness and headaches.    Allergies  Amoxicillin; Kiwi extract; Other; and Penicillins  Home Medications   Current Outpatient Rx  Name  Route  Sig  Dispense  Refill  . HYDROcodone-homatropine (HYDROMET) 5-1.5 MG/5ML syrup   Oral   Take 5 mLs by mouth every 6 (six) hours as needed for cough.   120 mL   0   . ibuprofen (ADVIL,MOTRIN) 200 MG tablet   Oral   Take 400 mg by mouth every 6 (six) hours as needed. For pain         . predniSONE (DELTASONE) 20 MG tablet      3 tabs po day one, then 2 po daily x 4 days   11 tablet   0    BP 136/99  Pulse 78  Temp(Src) 98.5 F (36.9 C) (Oral)  Resp 16  Ht 5\' 2"  (1.575 m)  Wt 307 lb (139.254 kg)  BMI 56.14 kg/m2  SpO2 100%  LMP 06/05/2013 Physical Exam  Constitutional: She is oriented to person, place, and time. She appears well-developed and well-nourished.  HENT:  Head: Normocephalic and atraumatic.  Right Ear: External ear normal.  Left Ear: External ear normal.  Mouth/Throat: Oropharynx is clear and moist.  Eyes: Pupils are equal, round, and reactive to light.  Neck: Normal range of  motion. Neck supple.  Cardiovascular: Normal rate, regular rhythm and normal heart sounds.   Pulmonary/Chest: Effort normal. No respiratory distress. She has wheezes (Mild expiratory wheezes bilaterally). She has no rales. She exhibits no tenderness.  Abdominal: Soft. Bowel sounds are normal. There is no tenderness. There is no rebound and no guarding.  Musculoskeletal: Normal range of motion. She exhibits no edema.  No calf tenderness  Lymphadenopathy:    She has no cervical adenopathy.  Neurological: She is alert and oriented to person, place, and  time.  Skin: Skin is warm and dry. No rash noted.  Psychiatric: She has a normal mood and affect.    ED Course  Procedures (including critical care time) Labs Review Labs Reviewed - No data to display Imaging Review Dg Chest 2 View  07/03/2013   CLINICAL DATA:  Productive cough  EXAM: CHEST  2 VIEW  COMPARISON:  07/17/2011  FINDINGS: The heart size and mediastinal contours are within normal limits. Both lungs are clear. The visualized skeletal structures are unremarkable.  IMPRESSION: No active cardiopulmonary disease.   Electronically Signed   By: Ruel Favors M.D.   On: 07/03/2013 17:05    EKG Interpretation   None       MDM   1. Bronchitis    Patient was treated with a non-Q on him, and prednisone and Hydromet. She has no evidence of pneumonia. She is otherwise well-appearing. She was discharged in good condition and advised to followup with her primary care physician or return to the ED if her symptoms are not improving.    Rolan Bucco, MD 07/03/13 [phone]

## 2013-07-03 NOTE — ED Notes (Signed)
Patient transported to X-ray 

## 2014-03-12 ENCOUNTER — Emergency Department (HOSPITAL_BASED_OUTPATIENT_CLINIC_OR_DEPARTMENT_OTHER)
Admission: EM | Admit: 2014-03-12 | Discharge: 2014-03-12 | Disposition: A | Discharge status: Home / Self Care | Payer: Self-pay | Attending: Emergency Medicine | Admitting: Emergency Medicine

## 2014-03-12 ENCOUNTER — Encounter (HOSPITAL_BASED_OUTPATIENT_CLINIC_OR_DEPARTMENT_OTHER): Payer: Self-pay | Admitting: Emergency Medicine

## 2014-03-12 ENCOUNTER — Emergency Department (HOSPITAL_BASED_OUTPATIENT_CLINIC_OR_DEPARTMENT_OTHER): Payer: Self-pay

## 2014-03-12 DIAGNOSIS — Z791 Long term (current) use of non-steroidal anti-inflammatories (NSAID): Secondary | ICD-10-CM | POA: Insufficient documentation

## 2014-03-12 DIAGNOSIS — Z880 Allergy status to penicillin: Secondary | ICD-10-CM | POA: Insufficient documentation

## 2014-03-12 DIAGNOSIS — M25559 Pain in unspecified hip: Secondary | ICD-10-CM | POA: Insufficient documentation

## 2014-03-12 DIAGNOSIS — M25551 Pain in right hip: Secondary | ICD-10-CM

## 2014-03-12 DIAGNOSIS — Z87891 Personal history of nicotine dependence: Secondary | ICD-10-CM | POA: Insufficient documentation

## 2014-03-12 MED ORDER — OXYCODONE-ACETAMINOPHEN 5-325 MG PO TABS
2.0000 | ORAL_TABLET | Freq: Once | ORAL | Status: AC
  Administered 2014-03-12: 2 via ORAL
  Filled 2014-03-12: qty 2

## 2014-03-12 MED ORDER — DIAZEPAM 5 MG PO TABS: 5.0000 mg | ORAL_TABLET | Freq: Three times a day (TID) | ORAL | Status: AC | PRN

## 2014-03-12 MED ORDER — IBUPROFEN 800 MG PO TABS
800.0000 mg | ORAL_TABLET | Freq: Once | ORAL | Status: AC
  Administered 2014-03-12: 800 mg via ORAL
  Filled 2014-03-12: qty 1

## 2014-03-12 MED ORDER — OXYCODONE-ACETAMINOPHEN 5-325 MG PO TABS: 1.0000 | ORAL_TABLET | ORAL | Status: DC | PRN

## 2014-03-12 MED ORDER — IBUPROFEN 800 MG PO TABS: 800.0000 mg | ORAL_TABLET | Freq: Three times a day (TID) | ORAL | Status: AC | PRN

## 2014-03-12 MED ORDER — DIAZEPAM 5 MG PO TABS
5.0000 mg | ORAL_TABLET | Freq: Once | ORAL | Status: AC
  Administered 2014-03-12: 5 mg via ORAL
  Filled 2014-03-12: qty 1

## 2014-03-12 NOTE — ED Notes (Signed)
Patient states she has had right hip pain with radiation into her right lower back and down her lateral right leg to her knee.  No known injury.

## 2014-03-12 NOTE — ED Provider Notes (Signed)
TIME SEEN: 11:35 AM  CHIEF COMPLAINT: Right hip pain  HPI: Pt is a 38 y.o. F who is morbidly obese who presents to the emergency department with complaints of right hip pain that radiates down her right lower extremity for the past month and a half. She denies any known injury. Denies any back pain. No numbness, tingling or focal weakness. No bowel or bladder incontinence. No urinary retention. No fever. Patient states her pain is worse with movement and better with rest. She describes it as severe in nature. She describes as a sharp, throbbing pain.  ROS: See HPI Constitutional: no fever  Eyes: no drainage  ENT: no runny nose   Cardiovascular:  no chest pain  Resp: no SOB  GI: no vomiting GU: no dysuria Integumentary: no rash  Allergy: no hives  Musculoskeletal: no leg swelling  Neurological: no slurred speech ROS otherwise negative  PAST MEDICAL HISTORY/PAST SURGICAL HISTORY:  History reviewed. No pertinent past medical history.  MEDICATIONS:  Prior to Admission medications   Medication Sig Start Date End Date Taking? Authorizing Provider  ibuprofen (ADVIL,MOTRIN) 200 MG tablet Take 400 mg by mouth every 6 (six) hours as needed. For pain   Yes Historical Provider, MD  HYDROcodone-homatropine (HYDROMET) 5-1.5 MG/5ML syrup Take 5 mLs by mouth every 6 (six) hours as needed for cough. 07/03/13   Rolan BuccoMelanie Belfi, MD  predniSONE (DELTASONE) 20 MG tablet 3 tabs po day one, then 2 po daily x 4 days 07/03/13   Rolan BuccoMelanie Belfi, MD    ALLERGIES:  Allergies  Allergen Reactions  . Amoxicillin Anaphylaxis  . Kiwi Extract Anaphylaxis  . Other Anaphylaxis    [place] nut allergy  . Penicillins Anaphylaxis and Nausea And Vomiting    SOCIAL HISTORY:  History  Substance Use Topics  . Smoking status: Former Games developermoker  . Smokeless tobacco: Never Used  . Alcohol Use: No    FAMILY HISTORY: Family History  Problem Relation Age of Onset  . Hypertension Other   . Diabetes Other   . COPD Other      EXAM: BP 137/98  Pulse 78  Temp(Src) 97.9 F (36.6 C) (Oral)  Ht 5\' 2"  (1.575 m)  Wt 245 lb (111.131 kg)  BMI 44.80 kg/m2  SpO2 99%  LMP 03/05/2014 CONSTITUTIONAL: Alert and oriented and responds appropriately to questions. Well-appearing; well-nourished, morbidly obese, nontoxic, appears uncomfortable and is tearful HEAD: Normocephalic EYES: Conjunctivae clear, PERRL ENT: normal nose; no rhinorrhea; moist mucous membranes; pharynx without lesions noted NECK: Supple, no meningismus, no LAD  CARD: RRR; S1 and S2 appreciated; no murmurs, no clicks, no rubs, no gallops RESP: Normal chest excursion without splinting or tachypnea; breath sounds clear and equal bilaterally; no wheezes, no rhonchi, no rales,  ABD/GI: Normal bowel sounds; non-distended; soft, non-tender, no rebound, no guarding BACK:  The back appears normal and is non-tender to palpation, there is no CVA tenderness; no midline spinal tenderness or step-off or deformity, no tenderness to palpation over the SI joint EXT: Patient is tender to palpation over the right lateral hip and buttock with no skin lesions appreciated no obvious bony deformity but exam is limited secondary to patient's body habitus, patient does have some pain with internal or external rotation of the hip, no pain along her IT band on the right side, no pain over her right knee or right ankle; 2+ DP pulses bilaterally, sensation to light touch intact diffusely, otherwise Normal ROM in all joints; non-tender to palpation; no edema; normal capillary refill; no  cyanosis    SKIN: Normal color for age and race; warm NEURO: Moves all extremities equally; sensation to light touch intact diffusely, cranial nerves II through XII intact, patient is able to ambulate with a slight limp secondary to right hip pain, 2+ bilateral Achilles and patellar deep tendon reflexes, no clonus PSYCH: The patient's mood and manner are appropriate. Grooming and personal hygiene are  appropriate.  MEDICAL DECISION MAKING: Patient here with morbid obesity with right hip pain for the past 1-1/2 months without injury. Pain may be secondary to sciatica, arthritis. No signs of bursitis or septic arthritis on exam. No back pain currently. No neurologic deficits. Given exam is limited due to her size, will obtain an x-ray of her hip she does have some bony tenderness over the lateral hip. We'll give pain medication and reassess.  ED PROGRESS: Patient's x-ray shows no acute bony abnormality. She states she is feeling much better and ready for discharge home. We'll get outpatient followup information as she does not have a PCP. Discussed supportive care instructions. This may be sciatica versus arthritis or tendinitis due to her morbid obesity. Have discussed with patient that either way would be treated the same. We'll discharge with prescription for Percocet, Valium and ibuprofen. Discussed return precautions. Patient and family verbalize understanding and are comfortable with plan. Her fianc is at bedside to drive her home.     Layla MawKristen N Ward, DO 03/12/14 1232

## 2014-03-12 NOTE — Discharge Instructions (Signed)
Arthralgia °Your caregiver has diagnosed you as suffering from an arthralgia. Arthralgia means there is pain in a joint. This can come from many reasons including: °· Bruising the joint which causes soreness (inflammation) in the joint. °· Wear and tear on the joints which occur as we grow older (osteoarthritis). °· Overusing the joint. °· Various forms of arthritis. °· Infections of the joint. °Regardless of the cause of pain in your joint, most of these different pains respond to anti-inflammatory drugs and rest. The exception to this is when a joint is infected, and these cases are treated with antibiotics, if it is a bacterial infection. °HOME CARE INSTRUCTIONS  °· Rest the injured area for as long as directed by your caregiver. Then slowly start using the joint as directed by your caregiver and as the pain allows. Crutches as directed may be useful if the ankles, knees or hips are involved. If the knee was splinted or casted, continue use and care as directed. If an stretchy or elastic wrapping bandage has been applied today, it should be removed and re-applied every 3 to 4 hours. It should not be applied tightly, but firmly enough to keep swelling down. Watch toes and feet for swelling, bluish discoloration, coldness, numbness or excessive pain. If any of these problems (symptoms) occur, remove the ace bandage and re-apply more loosely. If these symptoms persist, contact your caregiver or return to this location. °· For the first 24 hours, keep the injured extremity elevated on pillows while lying down. °· Apply ice for 15-20 minutes to the sore joint every couple hours while awake for the first half day. Then 03-04 times per day for the first 48 hours. Put the ice in a plastic bag and place a towel between the bag of ice and your skin. °· Wear any splinting, casting, elastic bandage applications, or slings as instructed. °· Only take over-the-counter or prescription medicines for pain, discomfort, or fever as  directed by your caregiver. Do not use aspirin immediately after the injury unless instructed by your physician. Aspirin can cause increased bleeding and bruising of the tissues. °· If you were given crutches, continue to use them as instructed and do not resume weight bearing on the sore joint until instructed. °Persistent pain and inability to use the sore joint as directed for more than 2 to 3 days are warning signs indicating that you should see a caregiver for a follow-up visit as soon as possible. Initially, a hairline fracture (break in bone) may not be evident on X-rays. Persistent pain and swelling indicate that further evaluation, non-weight bearing or use of the joint (use of crutches or slings as instructed), or further X-rays are indicated. X-rays may sometimes not show a small fracture until a week or 10 days later. Make a follow-up appointment with your own caregiver or one to whom we have referred you. A radiologist (specialist in reading X-rays) may read your X-rays. Make sure you know how you are to obtain your X-ray results. Do not assume everything is normal if you do not hear from us. °SEEK MEDICAL CARE IF: °Bruising, swelling, or pain increases. °SEEK IMMEDIATE MEDICAL CARE IF:  °· Your fingers or toes are numb or blue. °· The pain is not responding to medications and continues to stay the same or get worse. °· The pain in your joint becomes severe. °· You develop a fever over 102° F (38.9° C). °· It becomes impossible to move or use the joint. °MAKE SURE YOU:  °·   Understand these instructions.  Will watch your condition.  Will get help right away if you are not doing well or get worse. Document Released: 08/22/2005 Document Revised: 11/14/2011 Document Reviewed: 04/09/2008 [facility] Patient Information 2015 [place], [place]. This information is not intended to replace advice given to you by your health care provider. Make sure you discuss any questions you have with your health care  provider.   Possible Sciatica Sciatica is pain, weakness, numbness, or tingling along the path of the sciatic nerve. The nerve starts in the lower back and runs down the back of each leg. The nerve controls the muscles in the lower leg and in the back of the knee, while also providing sensation to the back of the thigh, lower leg, and the sole of your foot. Sciatica is a symptom of another medical condition. For instance, nerve damage or certain conditions, such as a herniated disk or bone spur on the spine, pinch or put pressure on the sciatic nerve. This causes the pain, weakness, or other sensations normally associated with sciatica. Generally, sciatica only affects one side of the body. CAUSES   Herniated or slipped disc.  Degenerative disk disease.  A pain disorder involving the narrow muscle in the buttocks (piriformis syndrome).  Pelvic injury or fracture.  Pregnancy.  Tumor (rare). SYMPTOMS  Symptoms can vary from mild to very severe. The symptoms usually travel from the low back to the buttocks and down the back of the leg. Symptoms can include:  Mild tingling or dull aches in the lower back, leg, or hip.  Numbness in the back of the calf or sole of the foot.  Burning sensations in the lower back, leg, or hip.  Sharp pains in the lower back, leg, or hip.  Leg weakness.  Severe back pain inhibiting movement. These symptoms may get worse with coughing, sneezing, laughing, or prolonged sitting or standing. Also, being overweight may worsen symptoms. DIAGNOSIS  Your caregiver will perform a physical exam to look for common symptoms of sciatica. He or she may ask you to do certain movements or activities that would trigger sciatic nerve pain. Other tests may be performed to find the cause of the sciatica. These may include:  Blood tests.  X-rays.  Imaging tests, such as an MRI or CT scan. TREATMENT  Treatment is directed at the cause of the sciatic pain. Sometimes,  treatment is not necessary and the pain and discomfort goes away on its own. If treatment is needed, your caregiver may suggest:  Over-the-counter medicines to relieve pain.  Prescription medicines, such as anti-inflammatory medicine, muscle relaxants, or narcotics.  Applying heat or ice to the painful area.  Steroid injections to lessen pain, irritation, and inflammation around the nerve.  Reducing activity during periods of pain.  Exercising and stretching to strengthen your abdomen and improve flexibility of your spine. Your caregiver may suggest losing weight if the extra weight makes the back pain worse.  Physical therapy.  Surgery to eliminate what is pressing or pinching the nerve, such as a bone spur or part of a herniated disk. HOME CARE INSTRUCTIONS   Only take over-the-counter or prescription medicines for pain or discomfort as directed by your caregiver.  Apply ice to the affected area for 20 minutes, 3-4 times a day for the first 48-72 hours. Then try heat in the same way.  Exercise, stretch, or perform your usual activities if these do not aggravate your pain.  Attend physical therapy sessions as directed by your caregiver.  Keep all follow-up appointments as directed by your caregiver.  Do not wear high heels or shoes that do not provide proper support.  Check your mattress to see if it is too soft. A firm mattress may lessen your pain and discomfort. SEEK IMMEDIATE MEDICAL CARE IF:   You lose control of your bowel or bladder (incontinence).  You have increasing weakness in the lower back, pelvis, buttocks, or legs.  You have redness or swelling of your back.  You have a burning sensation when you urinate.  You have pain that gets worse when you lie down or awakens you at night.  Your pain is worse than you have experienced in the past.  Your pain is lasting longer than 4 weeks.  You are suddenly losing weight without reason. MAKE SURE  YOU:  Understand these instructions.  Will watch your condition.  Will get help right away if you are not doing well or get worse. Document Released: 08/16/2001 Document Revised: 02/21/2012 Document Reviewed: 01/01/2012 [facility] Patient Information 2015 [place], [place]. This information is not intended to replace advice given to you by your health care provider. Make sure you discuss any questions you have with your health care provider.     Emergency Department Resource Guide 1) Find a Doctor and Pay Out of Pocket Although you won't have to find out who is covered by your insurance plan, it is a good idea to ask around and get recommendations. You will then need to call the office and see if the doctor you have chosen will accept you as a new patient and what types of options they offer for patients who are self-pay. Some doctors offer discounts or will set up payment plans for their patients who do not have insurance, but you will need to ask so you aren't surprised when you get to your appointment.  2) Contact Your Local Health Department Not all health departments have doctors that can see patients for sick visits, but many do, so it is worth a call to see if yours does. If you don't know where your local health department is, you can check in your phone book. The CDC also has a tool to help you locate your state's health department, and many state websites also have listings of all of their local health departments.  3) Find a Walk-in Clinic If your illness is not likely to be very severe or complicated, you may want to try a walk in clinic. These are popping up all over the country in pharmacies, drugstores, and shopping centers. They're usually staffed by nurse practitioners or physician assistants that have been trained to treat common illnesses and complaints. They're usually fairly quick and inexpensive. However, if you have serious medical issues or chronic medical problems, these are  probably not your best option.  No Primary Care Doctor: - Call Health Connect at  [phone] - they can help you locate a primary care doctor that  accepts your insurance, provides certain services, etc. - Physician Referral Service- [phone]  Chronic Pain Problems: Organization         Address  Phone   Notes  [facility]  [phone] Patients need to be referred by their primary care doctor.   Medication Assistance: Organization         Address  Phone   Notes  [facility] Medication [facility] [address] [place], [place] [address] [phone] --Must be a resident of [facility] --  Must have NO insurance coverage whatsoever (no Medicaid/ Medicare, etc.) -- The pt. MUST have a primary care doctor that directs their care regularly and follows them in the community   Med[facility]ist  [phone]   [employer]  [phone]    Agencies that provide inexpensive medical care: Organization         Address  Phone   Notes  [facility]  [phone]   [facility]    [phone]) [phone]   [facility] [address] [place], [place] [address] [phone]) [phone]   [facility] [place] [address], [place] [phone]   Planned Parenthood    [phone]   [facility]    [phone]) [phone]   [facility]  [address], [place] Phone:  [phone], Fax:  [phone] Hours of Operation:  9 am - 6 pm, M-F.  Also accepts Medicaid/Medicare and self-pay.  [facility]  [address], [place] Phone: [phone]) [phone], Fax: [phone]. Hours of Operation:  8:30 am - 5:30 pm, M-F.  Also accepts Medicaid and self-pay.  [facility] [place] [address], [place] Phone: [phone]) [phone]   [facility] [address], [place], [place] [phone], Ext. [phone] Mondays & Thursdays:  7-9 AM.  First 15 patients are seen on a first come, first serve basis.    Medicaid-accepting [facility] Providers:  Organization         Address  Phone   Notes  [facility] [address], [place] [phone]) [phone] Also accepts self-pay patients.  [facility] [address], [place]  [phone]) [phone]   [facility] [address], [place] [phone]   [facility] [address] [place] [address], [place] [phone]) [phone]   Renaye RakersVeita Bland [address], [place]   [phone]) [phone] Only accepts [place] Access [place] patients after they have their name applied to their card.   Self-Pay (no insurance) in [facility]:  Organization         Address  Phone   Notes  Sickle Cell Patients, [facility] [address], [place] [phone]   [facility] [address], [place] [phone]   [facility] [place]  [address],  [phone]) [phone]   [facility]/Dr. Osei-Bonsu  [address] [place] [address], [place] or [id number], [place] [phone] Phone number for both [place] and [place] locations is the same.  [facility] [address], [place] [phone]   [facility] [place] [address] [place] [address], [place] or [address] [phone]) [phone]   [facility] [address], [place] [phone], phone; [phone]) [phone], fax Sees patients 1st and 3rd Saturday of every month.  Must not qualify for public or private insurance (i.e. Medicaid, Medicare, [place] Health Choice, Veterans' Benefits)  Household income should be no more than 200% of the poverty level The clinic cannot treat you if you are pregnant or think you are pregnant  Sexually transmitted diseases are not treated at the clinic.     Dental Care: Organization         Address  Phone  Notes  [facility] [address], [place] [phone] Accepts children up to age 38 who are enrolled in [place] or [place] Health Choice; pregnant women with a Medicaid card; and children who have applied for Medicaid or [place] Health Choice, but were declined, whose parents can pay a reduced fee at time of service.  [facility] [place]  [address], [place] [phone]) [phone] Accepts children up to age 38 who are enrolled in [place] or [place] Health Choice; pregnant women with a Medicaid card; and children who have applied for Medicaid or  Health Choice, but were declined, whose parents can pay a reduced fee at time of service.  [facility]  [address], [place] [phone]) [phone] Patients are seen by appointment only. Walk-ins are not accepted. [facility] will see patients 38 years of age and older. Monday - Tuesday (8am-5pm) Most Wednesdays (8:30-5pm) $30 per visit, cash only  [facility]  [address], [facility] [phone]) [phone] Patients are seen by appointment only. Walk-ins are not accepted. [facility] will see patients 38 years of age and older. One Wednesday Evening (Monthly: Volunteer Based).  $30 per visit, cash only  [facility]  [phone] for adults; Children under age 534, call Graduate Pediatric Dentistry at [phone]. Children aged 804-14, please call [phone] to request a pediatric application.  Dental services are provided in all areas of dental care including fillings, crowns and bridges, complete and partial dentures, implants, gum treatment, root canals, and extractions. Preventive care is also provided. Treatment is provided to both adults and children. Patients are selected via a lottery and there is often a waiting  list.   [facility] [address], [place]  [phone] [email or web address]   [facility] [address], [place], [place] [phone], Ext. [phone] Second and Fourth Thursday of each month, opens at 6:30 AM; Clinic ends at 9 AM.  Patients are seen on a first-come first-served basis, and a limited number are seen during each clinic.   [facility]  [address], [place], [place] [phone]) [phone]   Eligibility Requirements You must have lived in [place], [place], or [place] counties for at least the last three months.   You cannot be eligible for state or federal sponsored [place] Cityhealthcare insurance, including CIGNAVeterans Administration, [place]icaid, or [employer] EntertainmentMedicare.   You generally cannot be eligible for healthcare insurance through your employer.    How to apply: Eligibility screenings are held every Tuesday and Wednesday afternoon from 1:00 pm until 4:00 pm. You do not need an appointment for the interview!  [facility] [address], [place], [place] [phone]   [facility]  [phone]   [facility]  [phone]   [facility]  [phone]    Behavioral Health Resources in the Community: Intensive Outpatient Programs Organization         Address  Phone  Notes  [facility] [address], [place], [place] [phone]   [facility] [address], [place], [place] [phone]   [facility] [address], [place], [place]  [phone]   [facility] [address],  [place], [place] [phone] or [phone]   Substance Abuse Resources Organization  Address  Phone  Notes  [facility] Drug [facility]  [phone]   [facility]  [phone]   [facility]  [phone]   Floydene Flock  [phone]   Residential & Outpatient Substance Abuse Program   3215480605   Psychological Services Organization         Address  Phone  Notes  [facility]  [phone]   [facility]  [phone]   [facility] [address] [place] [phone] or [phone]    Mobile Crisis Teams Organization         Address  Phone  Notes  [facility], Mobile Crisis Care Unit  [phone]   [facility]  [address] [place] [address] [place], [place] [phone]   Doristine Locks [address] [place] [phone]    Self-Help/Support Groups Organization         Address  Phone             Notes  [facility] [place] - variety of support groups  [phone]- I7437963 Call for more information  Narcotics Anonymous (NA), [facility] [address], [employer] [place]  2 meetings at this location   Statistician         Address  Phone  Notes  [facility] [address],    [place]  [phone]   [facility]  [address], [place] [address], [place], [place] [phone]   [facility] [address] [place], [place] [phone] Admissions: 8am-3pm M-F  [facility] [address]    [place], [place] [phone]   [facility] [address] [place], [place], [place] [phone]   [facility] [address],  [place], [place] [phone]   [facility] - Intensive Outpatient [address], [place], [place] [phone]   [facility] ([facility] [address] [place].,  [place], [place] [phone] or [phone]   [facility] ([facility]) [address] [place], [place] [phone] Accepts Medicaid  [facility] [place] [address],  [place] [phone] Substance Abuse/Addiction Treatment   [facility] Organization          Address  Phone  Notes  [facility]  [phone]   Angie Fava, PhD [address] [place], [place]   [phone] or [phone]   [facility]   [address] [place], [place] [phone]   [facility] [address] [place], [place] [phone] Insurance/Medicaid/sponsorship through [facility] [address]                                    [place], [place] [phone] Therapy/tele-psych/case  [facility] [address] [place], [place] [phone]    Dr. Lolly Mustache  [phone]   [facility] [place]  [employer] [facility] Dept. 1) [address], [place] 2) [address], [place] 3)  [address], [place] [phone]  [phone]   [facility] Child Abuse Hotline 7854226167 or [phone] (After Hours)

## 2014-07-08 ENCOUNTER — Emergency Department (HOSPITAL_COMMUNITY)
Admission: EM | Admit: 2014-07-08 | Discharge: 2014-07-08 | Disposition: A | Discharge status: Home / Self Care | Payer: Self-pay | Attending: Emergency Medicine | Admitting: Emergency Medicine

## 2014-07-08 ENCOUNTER — Encounter (HOSPITAL_COMMUNITY): Payer: Self-pay

## 2014-07-08 DIAGNOSIS — Z7952 Long term (current) use of systemic steroids: Secondary | ICD-10-CM | POA: Insufficient documentation

## 2014-07-08 DIAGNOSIS — Z87891 Personal history of nicotine dependence: Secondary | ICD-10-CM | POA: Insufficient documentation

## 2014-07-08 DIAGNOSIS — M25551 Pain in right hip: Secondary | ICD-10-CM | POA: Insufficient documentation

## 2014-07-08 DIAGNOSIS — M5441 Lumbago with sciatica, right side: Secondary | ICD-10-CM | POA: Insufficient documentation

## 2014-07-08 DIAGNOSIS — Z880 Allergy status to penicillin: Secondary | ICD-10-CM | POA: Insufficient documentation

## 2014-07-08 MED ORDER — CYCLOBENZAPRINE HCL 10 MG PO TABS
10.0000 mg | ORAL_TABLET | Freq: Once | ORAL | Status: AC
  Administered 2014-07-08: 10 mg via ORAL
  Filled 2014-07-08: qty 1

## 2014-07-08 MED ORDER — OXYCODONE-ACETAMINOPHEN 5-325 MG PO TABS
1.0000 | ORAL_TABLET | Freq: Once | ORAL | Status: AC
  Administered 2014-07-08: 1 via ORAL
  Filled 2014-07-08: qty 1

## 2014-07-08 MED ORDER — HYDROCODONE-ACETAMINOPHEN 5-325 MG PO TABS: 1.0000 | ORAL_TABLET | ORAL | Status: DC | PRN

## 2014-07-08 MED ORDER — CYCLOBENZAPRINE HCL 10 MG PO TABS: 10.0000 mg | ORAL_TABLET | Freq: Two times a day (BID) | ORAL | Status: AC | PRN

## 2014-07-08 NOTE — ED Provider Notes (Signed)
CSN: [id number]     Arrival date & time 07/08/14  1520 History  This chart was scribed for non-physician practitioner, Elpidio AnisShari Chandy Tarman, PA-C, working with Lyanne CoKevin M Campos, MD, by Bronson CurbJacqueline Melvin, ED Scribe. This patient was seen in room TR11C/TR11C and the patient's care was started at 4:31 PM.    Chief Complaint  Patient presents with  . Hip Pain  . Back Pain    Patient is a 38 y.o. female presenting with hip pain and back pain. The history is provided by the patient. No language interpreter was used.  Hip Pain This is a recurrent problem. The current episode started more than 1 week ago. The problem occurs constantly. The problem has not changed since onset.Pertinent negatives include no abdominal pain. Nothing relieves the symptoms. She has tried acetaminophen for the symptoms. The treatment provided no relief.  Back Pain Location:  Lumbar spine Radiates to:  Does not radiate Pain severity:  Severe Pain is:  Same all the time Timing:  Constant Relieved by:  Nothing Ineffective treatments:  Ibuprofen Associated symptoms: no abdominal pain, no bladder incontinence, no bowel incontinence, no fever, no numbness, no tingling and no weakness      HPI Comments: [name] is a 38 y.o. female who presents to the Emergency Department complaining of severe non-radiating right hip and lower back pain that has been ongoing for "months". Patient states she was diagnosed with a lumbar disease approximately 3 months ago, however, patient states that she does not have insurance and has been unable to have this further evaluated.  Patient states she has been taking Tylenol and ibuprofen without significant improvement. She denies any falls or recent injury. She denies any bowel/bladder incontinence.   History reviewed. No pertinent past medical history. Past Surgical History  Procedure Laterality Date  . Cholecystectomy     Family History  Problem Relation Age of Onset  . Hypertension Other    . Diabetes Other   . COPD Other    History  Substance Use Topics  . Smoking status: Former Games developermoker  . Smokeless tobacco: Never Used  . Alcohol Use: No   OB History    No data available     Review of Systems  Constitutional: Negative for fever.  Cardiovascular: Negative for leg swelling.  Gastrointestinal: Negative for abdominal pain and bowel incontinence.  Genitourinary: Negative for bladder incontinence and difficulty urinating.  Musculoskeletal: Positive for back pain (lower) and arthralgias (right hip).  Neurological: Negative for tingling, weakness and numbness.      Allergies  Amoxicillin; Kiwi extract; Other; and Penicillins  Home Medications   Prior to Admission medications   Medication Sig Start Date End Date Taking? Authorizing Provider  diazepam (VALIUM) 5 MG tablet Take 1 tablet (5 mg total) by mouth every 8 (eight) hours as needed for muscle spasms. 03/12/14   Kristen N Ward, DO  HYDROcodone-homatropine (HYDROMET) 5-1.5 MG/5ML syrup Take 5 mLs by mouth every 6 (six) hours as needed for cough. 07/03/13   Rolan BuccoMelanie Belfi, MD  ibuprofen (ADVIL,MOTRIN) 200 MG tablet Take 400 mg by mouth every 6 (six) hours as needed. For pain    Historical Provider, MD  ibuprofen (ADVIL,MOTRIN) 800 MG tablet Take 1 tablet (800 mg total) by mouth every 8 (eight) hours as needed for mild pain. 03/12/14   Kristen N Ward, DO  oxyCODONE-acetaminophen (PERCOCET/ROXICET) 5-325 MG per tablet Take 1 tablet by mouth every 4 (four) hours as needed. 03/12/14   Kristen N Ward, DO  predniSONE (  DELTASONE) 20 MG tablet 3 tabs po day one, then 2 po daily x 4 days 07/03/13   Rolan BuccoMelanie Belfi, MD   Triage Vitals: BP 122/72 mmHg  Pulse 80  Temp(Src) 98.1 F (36.7 C) (Oral)  Resp 20  Ht 5\' 2"  (1.575 m)  Wt 280 lb (127.007 kg)  BMI 51.20 kg/m2  SpO2 100%  LMP 07/06/2014  Physical Exam  Constitutional: She is oriented to person, place, and time. She appears well-developed and well-nourished. No distress.   HENT:  Head: Normocephalic and atraumatic.  Eyes: Conjunctivae and EOM are normal.  Neck: No tracheal deviation present.  Cardiovascular: Normal rate.   Pulmonary/Chest: Effort normal. No respiratory distress.  Abdominal: There is no tenderness.  Musculoskeletal: Normal range of motion. She exhibits tenderness.  Midline and right lumbar tenderness. No swelling although exam limited by body habitus. Fully weight bearing. No abdominal pain.  Neurological: She is alert and oriented to person, place, and time.  Skin: Skin is warm and dry.  Psychiatric: She has a normal mood and affect. Her behavior is normal.  Nursing note and vitals reviewed.   ED Course  Procedures (including critical care time)  DIAGNOSTIC STUDIES: Oxygen Saturation is 100% on room air, normal by my interpretation.    COORDINATION OF CARE: At 1634 Discussed treatment plan with patient which includes pain medication. Patient agrees.   Labs Review Labs Reviewed - No data to display  Imaging Review No results found.   EKG Interpretation None      MDM   Final diagnoses:  None    1. Lumbar radiculopathy  No neurologic deficits. Back pain that follows pattern of L5-S1 radiculopathy without weakness. Referred for outpatient management.   I personally performed the services described in this documentation, which was scribed in my presence. The recorded information has been reviewed and is accurate.     Arnoldo HookerShari A Barry Faircloth, PA-C 07/08/14 1703

## 2014-07-08 NOTE — Discharge Instructions (Signed)
Back Pain, Adult °Back pain is very common. The pain often gets better over time. The cause of back pain is usually not dangerous. Most people can learn to manage their back pain on their own.  °HOME CARE  °· Stay active. Start with short walks on flat ground if you can. Try to walk farther each day. °· Do not sit, drive, or stand in one place for more than 30 minutes. Do not stay in bed. °· Do not avoid exercise or work. Activity can help your back heal faster. °· Be careful when you bend or lift an object. Bend at your knees, keep the object close to you, and do not twist. °· Sleep on a firm mattress. Lie on your side, and bend your knees. If you lie on your back, put a pillow under your knees. °· Only take medicines as told by your doctor. °· Put ice on the injured area. °¨ Put ice in a plastic bag. °¨ Place a towel between your skin and the bag. °¨ Leave the ice on for 15-20 minutes, 03-04 times a day for the first 2 to 3 days. After that, you can switch between ice and heat packs. °· Ask your doctor about back exercises or massage. °· Avoid feeling anxious or stressed. Find good ways to deal with stress, such as exercise. °GET HELP RIGHT AWAY IF:  °· Your pain does not go away with rest or medicine. °· Your pain does not go away in 1 week. °· You have new problems. °· You do not feel well. °· The pain spreads into your legs. °· You cannot control when you poop (bowel movement) or pee (urinate). °· Your arms or legs feel weak or lose feeling (numbness). °· You feel sick to your stomach (nauseous) or throw up (vomit). °· You have belly (abdominal) pain. °· You feel like you may pass out (faint). °MAKE SURE YOU:  °· Understand these instructions. °· Will watch your condition. °· Will get help right away if you are not doing well or get worse. °Document Released: 02/08/2008 Document Revised: 11/14/2011 Document Reviewed: 12/24/2013 °[employer]® Patient Information ©2015 [employer]. This information is not intended  to replace advice given to you by your health care provider. Make sure you discuss any questions you have with your health care provider. ° °

## 2014-07-08 NOTE — ED Notes (Signed)
Pt having right hip and lower back pain for the past month and pain is worse today. Has been taking tylenol with no relief.

## 2014-10-18 ENCOUNTER — Emergency Department (HOSPITAL_BASED_OUTPATIENT_CLINIC_OR_DEPARTMENT_OTHER)
Admission: EM | Admit: 2014-10-18 | Discharge: 2014-10-18 | Disposition: A | Discharge status: Home / Self Care | Payer: Self-pay | Attending: Emergency Medicine | Admitting: Emergency Medicine

## 2014-10-18 DIAGNOSIS — M542 Cervicalgia: Secondary | ICD-10-CM | POA: Insufficient documentation

## 2014-10-18 DIAGNOSIS — H9202 Otalgia, left ear: Secondary | ICD-10-CM | POA: Insufficient documentation

## 2014-10-18 DIAGNOSIS — Z7952 Long term (current) use of systemic steroids: Secondary | ICD-10-CM | POA: Insufficient documentation

## 2014-10-18 DIAGNOSIS — K006 Disturbances in tooth eruption: Secondary | ICD-10-CM | POA: Insufficient documentation

## 2014-10-18 DIAGNOSIS — Z87891 Personal history of nicotine dependence: Secondary | ICD-10-CM | POA: Insufficient documentation

## 2014-10-18 DIAGNOSIS — Z79899 Other long term (current) drug therapy: Secondary | ICD-10-CM | POA: Insufficient documentation

## 2014-10-18 DIAGNOSIS — J029 Acute pharyngitis, unspecified: Secondary | ICD-10-CM | POA: Insufficient documentation

## 2014-10-18 DIAGNOSIS — Z880 Allergy status to penicillin: Secondary | ICD-10-CM | POA: Insufficient documentation

## 2014-10-18 MED ORDER — HYDROCODONE-ACETAMINOPHEN 5-325 MG PO TABS
1.0000 | ORAL_TABLET | Freq: Once | ORAL | Status: AC
  Administered 2014-10-18: 1 via ORAL
  Filled 2014-10-18: qty 1

## 2014-10-18 MED ORDER — CIPROFLOXACIN-DEXAMETHASONE 0.3-0.1 % OT SUSP
4.0000 [drp] | Freq: Once | OTIC | Status: AC
  Administered 2014-10-18: 4 [drp] via OTIC
  Filled 2014-10-18: qty 7.5

## 2014-10-18 MED ORDER — HYDROCODONE-ACETAMINOPHEN 5-325 MG PO TABS: ORAL_TABLET | ORAL | Status: DC

## 2014-10-18 NOTE — Discharge Instructions (Signed)
Please read and follow all provided instructions.  Your diagnoses today include:  1. Ear pain, left     Tests performed today include:  Vital signs. See below for your results today.   Medications prescribed:   Ciprodex ear drops - Use 4 drops in left ear twice a day for 7 days   Vicodin (hydrocodone/acetaminophen) - narcotic pain medication  DO NOT drive or perform any activities that require you to be awake and alert because this medicine can make you drowsy. BE VERY CAREFUL not to take multiple medicines containing Tylenol (also called acetaminophen). Doing so can lead to an overdose which can damage your liver and cause liver failure and possibly death.  Take any prescribed medications only as directed.  Home care instructions:  Follow any educational materials contained in this packet.  BE VERY CAREFUL not to take multiple medicines containing Tylenol (also called acetaminophen). Doing so can lead to an overdose which can damage your liver and cause liver failure and possibly death.   Follow-up instructions: Please follow-up with your primary care provider in the next 3 days for further evaluation of your symptoms.   Return instructions:   Please return to the Emergency Department if you experience worsening symptoms.   Please return if you have any other emergent concerns.  Additional Information:  Your vital signs today were: BP 154/87 mmHg   Pulse 90   Temp(Src) 98.3 F (36.8 C) (Oral)   Resp 20   Ht 5' 1.5" (1.562 m)   Wt 323 lb (146.512 kg)   BMI 60.05 kg/m2   SpO2 98%   LMP 10/06/2014 If your blood pressure (BP) was elevated above 135/85 this visit, please have this repeated by your doctor within one month. --------------

## 2014-10-18 NOTE — ED Notes (Signed)
Pt states left ear pain since yesterday with no known trauma to the ear.  Pt states minor congestion and trouble swallowing due to pressure felt in lateral neck with swallowing.

## 2014-10-18 NOTE — ED Notes (Signed)
Left ear pain since yesterday- taking ibuprofen and tylenol without relief-

## 2014-10-18 NOTE — ED Provider Notes (Signed)
CSN: [id number]     Arrival date & time 10/18/14  1811 History   First MD Initiated Contact with Patient 10/18/14 1823     Chief Complaint  Patient presents with  . Otalgia     (Consider location/radiation/quality/duration/timing/severity/associated sxs/prior Treatment) HPI Comments: Patient presents with 2 days of left ear pain. Ear pain started gradually. No trauma. Pain is worse with palpation and movement of the ear and palpation of the lateral cheek into the neck. Patient states that she has a left-sided sore throat when swallowing but is able swallow without difficulty. She denies other URI symptoms including fever. No pain with movement of her neck. She has been taking ibuprofen and Tylenol without relief of symptoms. No hearing change, no vision change or loss. No injuries or recent manipulations.   Patient is a 39 y.o. female presenting with ear pain. The history is provided by the patient.  Otalgia Associated symptoms: sore throat   Associated symptoms: no abdominal pain, no cough, no diarrhea, no ear discharge, no fever, no headaches, no neck pain, no rash, no rhinorrhea and no vomiting     No past medical history on file. Past Surgical History  Procedure Laterality Date  . Cholecystectomy     Family History  Problem Relation Age of Onset  . Hypertension Other   . Diabetes Other   . COPD Other    History  Substance Use Topics  . Smoking status: Former Games developer  . Smokeless tobacco: Never Used  . Alcohol Use: No   OB History    No data available     Review of Systems  Constitutional: Negative for fever, chills and fatigue.  HENT: Positive for ear pain and sore throat. Negative for dental problem, ear discharge, rhinorrhea, sinus pressure and trouble swallowing.   Eyes: Negative for redness.  Respiratory: Negative for cough and wheezing.   Gastrointestinal: Negative for nausea, vomiting, abdominal pain and diarrhea.  Genitourinary: Negative for dysuria.   Musculoskeletal: Negative for myalgias, neck pain and neck stiffness.  Skin: Negative for rash.  Neurological: Negative for headaches.  Hematological: Negative for adenopathy.      Allergies  Amoxicillin; Kiwi extract; Other; Penicillins; and Tramadol  Home Medications   Prior to Admission medications   Medication Sig Start Date End Date Taking? Authorizing Provider  ibuprofen (ADVIL,MOTRIN) 800 MG tablet Take 1 tablet (800 mg total) by mouth every 8 (eight) hours as needed for mild pain. 03/12/14  Yes Kristen N Ward, DO  cyclobenzaprine (FLEXERIL) 10 MG tablet Take 1 tablet (10 mg total) by mouth 2 (two) times daily as needed for muscle spasms. 07/08/14   Shari A Upstill, PA-C  diazepam (VALIUM) 5 MG tablet Take 1 tablet (5 mg total) by mouth every 8 (eight) hours as needed for muscle spasms. 03/12/14   Kristen N Ward, DO  HYDROcodone-acetaminophen (NORCO/VICODIN) 5-325 MG per tablet Take 1-2 tablets every 6 hours as needed for severe pain 10/18/14   Renne Crigler, PA-C  HYDROcodone-homatropine (HYDROMET) 5-1.5 MG/5ML syrup Take 5 mLs by mouth every 6 (six) hours as needed for cough. 07/03/13   Rolan Bucco, MD  ibuprofen (ADVIL,MOTRIN) 200 MG tablet Take 400 mg by mouth every 6 (six) hours as needed. For pain    Historical Provider, MD  oxyCODONE-acetaminophen (PERCOCET/ROXICET) 5-325 MG per tablet Take 1 tablet by mouth every 4 (four) hours as needed. 03/12/14   Kristen N Ward, DO  predniSONE (DELTASONE) 20 MG tablet 3 tabs po day one, then 2 po daily x  4 days 07/03/13   Rolan Bucco, MD   BP 154/87 mmHg  Pulse 90  Temp(Src) 98.3 F (36.8 C) (Oral)  Resp 20  Ht 5' 1.5" (1.562 m)  Wt 323 lb (146.512 kg)  BMI 60.05 kg/m2  SpO2 98%  LMP 10/06/2014   Physical Exam  Constitutional: She appears well-developed and well-nourished.  HENT:  Head: Normocephalic and atraumatic.  Right Ear: Tympanic membrane, external ear and ear canal normal.  Left Ear: Tympanic membrane and ear canal  normal. There is tenderness (pulling on auricle, insertion of speculum). No drainage or swelling. No foreign bodies. No mastoid tenderness. Tympanic membrane is not injected, not scarred, not perforated, not erythematous, not retracted and not bulging.  No middle ear effusion. No hemotympanum. No decreased hearing is noted.  Nose: Nose normal. No mucosal edema or rhinorrhea.  Mouth/Throat: Uvula is midline, oropharynx is clear and moist and mucous membranes are normal. Mucous membranes are not dry. No oral lesions. No trismus in the jaw. No uvula swelling. No oropharyngeal exudate, posterior oropharyngeal edema, posterior oropharyngeal erythema or tonsillar abscesses.  Poor dentition but no active infection noted.  Eyes: Conjunctivae are normal. Right eye exhibits no discharge. Left eye exhibits no discharge.  Neck: Normal range of motion. Neck supple. No JVD present. No tracheal deviation present. No thyromegaly present.  Mild tenderness left lateral neck. Normal carotid pulses. No meningeal signs or pain with movement of neck in all directions. No cellulitis. No nodules.  Cardiovascular: Normal rate, regular rhythm and normal heart sounds.   Pulmonary/Chest: Effort normal and breath sounds normal. No respiratory distress. She has no wheezes. She has no rales.  Abdominal: Soft. There is no tenderness.  Lymphadenopathy:    She has no cervical adenopathy.  Neurological: She is alert.  Skin: Skin is warm and dry.  Psychiatric: She has a normal mood and affect.  Nursing note and vitals reviewed.   ED Course  Procedures (including critical care time) Labs Review Labs Reviewed - No data to display  Imaging Review No results found.   EKG Interpretation None       7:11 PM Patient seen and examined.    Vital signs reviewed and are as follows: BP 154/87 mmHg  Pulse 90  Temp(Src) 98.3 F (36.8 C) (Oral)  Resp 20  Ht 5' 1.5" (1.562 m)  Wt 323 lb (146.512 kg)  BMI 60.05 kg/m2  SpO2 98%   LMP 10/06/2014  Will give trial of Ciprodex eardrops, short course of Vicodin at home for discomfort.  Counseled to return to the ED with new or worsening symptoms.  Patient counseled on use of narcotic pain medications. Counseled not to combine these medications with others containing tylenol. Urged not to drink alcohol, drive, or perform any other activities that requires focus while taking these medications. The patient verbalizes understanding and agrees with the plan.   MDM   Final diagnoses:  Ear pain, left   Patient with left ear pain, essentially normal examination except for tenderness when pulling the auricle, inserting a speculum into the ear canal, palpating inferior to the ear. No hearing change. No swelling. No drainage from the ear. No skin redness or warmth to suggest cellulitis. No palpable abscesses or nodules. No neurological changes or recent manipulations. Doubt carotid dissection. Will give course of Ciprodex eardrops given ear canal pain. Will treat pain. Patient counseled to monitor symptoms closely and to return with new or changing symptoms.    Renne Crigler, PA-C 10/18/14 1918  Rolland Porter,  MD 10/22/14 1630

## 2014-12-09 ENCOUNTER — Encounter (HOSPITAL_COMMUNITY): Payer: Self-pay

## 2014-12-09 ENCOUNTER — Emergency Department (HOSPITAL_COMMUNITY)
Admission: EM | Admit: 2014-12-09 | Discharge: 2014-12-09 | Disposition: A | Discharge status: Home / Self Care | Payer: Self-pay | Attending: Emergency Medicine | Admitting: Emergency Medicine

## 2014-12-09 ENCOUNTER — Emergency Department (HOSPITAL_COMMUNITY): Payer: Self-pay

## 2014-12-09 DIAGNOSIS — Z79899 Other long term (current) drug therapy: Secondary | ICD-10-CM | POA: Insufficient documentation

## 2014-12-09 DIAGNOSIS — Z880 Allergy status to penicillin: Secondary | ICD-10-CM | POA: Insufficient documentation

## 2014-12-09 DIAGNOSIS — Y9389 Activity, other specified: Secondary | ICD-10-CM | POA: Insufficient documentation

## 2014-12-09 DIAGNOSIS — Y998 Other external cause status: Secondary | ICD-10-CM | POA: Insufficient documentation

## 2014-12-09 DIAGNOSIS — S99912A Unspecified injury of left ankle, initial encounter: Secondary | ICD-10-CM | POA: Insufficient documentation

## 2014-12-09 DIAGNOSIS — Z87891 Personal history of nicotine dependence: Secondary | ICD-10-CM | POA: Insufficient documentation

## 2014-12-09 DIAGNOSIS — W1839XA Other fall on same level, initial encounter: Secondary | ICD-10-CM | POA: Insufficient documentation

## 2014-12-09 DIAGNOSIS — Y9289 Other specified places as the place of occurrence of the external cause: Secondary | ICD-10-CM | POA: Insufficient documentation

## 2014-12-09 MED ORDER — HYDROCODONE-ACETAMINOPHEN 5-325 MG PO TABS
1.0000 | ORAL_TABLET | Freq: Once | ORAL | Status: AC
  Administered 2014-12-09: 1 via ORAL
  Filled 2014-12-09: qty 1

## 2014-12-09 NOTE — ED Notes (Signed)
Pt with left foot and ankle pain post fall.  Twisted ankle this morning.  Stepped in hole.

## 2014-12-09 NOTE — ED Provider Notes (Signed)
CSN: [id number]     Arrival date & time 12/09/14  1448 History   First MD Initiated Contact with Patient 12/09/14 1559     Chief Complaint  Patient presents with  . Foot Pain  . Ankle Pain     (Consider location/radiation/quality/duration/timing/severity/associated sxs/prior Treatment) HPI  [name] is a 39 y.o. female presenting with fall. Patient states she was walking in her left foot when in a hole and she tested her ankle. She reported sharp pain that is not improved with ibuprofen. Patient says ambulation makes it worse. Patient reports mild swelling or erythema fevers chills nausea vomiting or weakness.   History reviewed. No pertinent past medical history. Past Surgical History  Procedure Laterality Date  . Cholecystectomy     Family History  Problem Relation Age of Onset  . Hypertension Other   . Diabetes Other   . COPD Other    History  Substance Use Topics  . Smoking status: Former Games developer  . Smokeless tobacco: Never Used  . Alcohol Use: No   OB History    No data available     Review of Systems  Musculoskeletal: Positive for myalgias. Negative for joint swelling.  Skin: Negative for color change and wound.  Neurological: Negative for weakness and numbness.      Allergies  Amoxicillin; Ketorolac; Kiwi extract; Other; Penicillins; and Tramadol  Home Medications   Prior to Admission medications   Medication Sig Start Date End Date Taking? Authorizing Provider  acetaminophen (TYLENOL) 500 MG tablet Take 1,000 mg by mouth every 6 (six) hours as needed (pain.).   Yes Historical Provider, MD  ibuprofen (ADVIL,MOTRIN) 800 MG tablet Take 1 tablet (800 mg total) by mouth every 8 (eight) hours as needed for mild pain. 03/12/14  Yes Kristen N Ward, DO  cyclobenzaprine (FLEXERIL) 10 MG tablet Take 1 tablet (10 mg total) by mouth 2 (two) times daily as needed for muscle spasms. Patient not taking: Reported on 12/09/2014 07/08/14   Elpidio Anis, PA-C  diazepam  (VALIUM) 5 MG tablet Take 1 tablet (5 mg total) by mouth every 8 (eight) hours as needed for muscle spasms. Patient not taking: Reported on 12/09/2014 03/12/14   Layla Maw Ward, DO  HYDROcodone-acetaminophen (NORCO/VICODIN) 5-325 MG per tablet Take 1-2 tablets every 6 hours as needed for severe pain Patient not taking: Reported on 12/09/2014 10/18/14   Renne Crigler, PA-C  HYDROcodone-homatropine (HYDROMET) 5-1.5 MG/5ML syrup Take 5 mLs by mouth every 6 (six) hours as needed for cough. Patient not taking: Reported on 12/09/2014 07/03/13   Rolan Bucco, MD  ibuprofen (ADVIL,MOTRIN) 200 MG tablet Take 400 mg by mouth every 6 (six) hours as needed. For pain    Historical Provider, MD  oxyCODONE-acetaminophen (PERCOCET/ROXICET) 5-325 MG per tablet Take 1 tablet by mouth every 4 (four) hours as needed. Patient not taking: Reported on 12/09/2014 03/12/14   Layla Maw Ward, DO  predniSONE (DELTASONE) 20 MG tablet 3 tabs po day one, then 2 po daily x 4 days Patient not taking: Reported on 12/09/2014 07/03/13   Rolan Bucco, MD   BP 145/100 mmHg  Pulse 89  Temp(Src) 97.8 F (36.6 C) (Oral)  Resp 20  SpO2 100%  LMP 12/05/2014 Physical Exam  Constitutional: She appears well-developed and well-nourished. No distress.  HENT:  Head: Normocephalic and atraumatic.  Eyes: Conjunctivae are normal. Right eye exhibits no discharge. Left eye exhibits no discharge.  Cardiovascular:  2+ pedal pulses  Pulmonary/Chest: Effort normal. No respiratory distress.  Musculoskeletal:  Left ankle: Tenderness to lateral malleolus. Minimal swelling. No ecchymoses, warmth, redness. Full range of motion without tenderness. No tenderness to right proximal fibular head. No tenderness over base of fifth metatarsal or navicular. No tenderness of lateral malleolous  Neurological: She is alert. Coordination normal.  Strength and sensation intact  Skin: She is not diaphoretic.  Psychiatric: She has a normal mood and affect. Her behavior is  normal.  Nursing note and vitals reviewed.   ED Course  Procedures (including critical care time) Labs Review Labs Reviewed - No data to display  Imaging Review Dg Ankle Complete Left  12/09/2014   CLINICAL DATA:  Ankle injury, pain/swelling  EXAM: LEFT ANKLE COMPLETE - 3+ VIEW  COMPARISON:  None.  FINDINGS: No fracture or dislocation is seen.  The ankle mortise is intact.  The base of the fifth metatarsal is unremarkable.  Moderate plantar calcaneal enthesophyte.  Visualized soft tissues are within normal limits.  IMPRESSION: No fracture or dislocation is seen.   Electronically Signed   By: Charline BillsSriyesh  Krishnan M.D.   On: 12/09/2014 15:52   Dg Foot Complete Left  12/09/2014   CLINICAL DATA:  Stepped into a hole. Ankle swelling and pain with weight-bearing.  EXAM: LEFT FOOT - COMPLETE 3+ VIEW  COMPARISON:  12/09/2014 in 01/2019 1/14  FINDINGS: Mild spurring at the base of the first metatarsal. No malalignment at the Lisfranc joint. No metatarsal fracture observed. First digit sesamoids and the phalanges appear unremarkable.  Plantar and Achilles calcaneal spurs.  Dorsal midfoot spurring.  IMPRESSION: 1. Calcaneal spurs with midfoot spurring and mild spurring at the first tarsometatarsal articulation, but no acute bony findings are identified.   Electronically Signed   By: Gaylyn RongWalter  Liebkemann M.D.   On: 12/09/2014 15:53     EKG Interpretation None      MDM   Final diagnoses:  Left ankle injury, initial encounter   Patient presenting with left ankle sprain. Neurovascularly intact. I doubt septic arthritis. Patient given ASO in the ED pain managed. Xray without acute findings. Discussed spurs are chronic. Patient advised on rice protocol and NSAID use. She is to follow-up with PCP or orthopedics for persistent symptoms. Patient verbalizes agreement with plan.   Oswaldo ConroyVictoria Dwayne Bulkley, PA-C 12/09/14 1636  Elwin MochaBlair Walden, MD 12/09/14 2352

## 2014-12-09 NOTE — Discharge Instructions (Signed)
Return to the emergency room with worsening of symptoms, new symptoms or with symptoms that are concerning, especially fevers, redness, swelling, numbness, tingling, weakness. RICE: Rest, Ice (three cycles of 20 mins on, 20mins off at least twice a day), compression/brace, elevation. Heating pad works well for back pain. Ibuprofen 400mg  (2 tablets 200mg ) every 5-6 hours for 3-5 days. Follow up with PCP/orthopedist if symptoms worsen or are persistent. Read below information and follow recommendations. Ankle Sprain An ankle sprain is an injury to the strong, fibrous tissues (ligaments) that hold the bones of your ankle joint together.  CAUSES An ankle sprain is usually caused by a fall or by twisting your ankle. Ankle sprains most commonly occur when you step on the outer edge of your foot, and your ankle turns inward. People who participate in sports are more prone to these types of injuries.  SYMPTOMS   Pain in your ankle. The pain may be present at rest or only when you are trying to stand or walk.  Swelling.  Bruising. Bruising may develop immediately or within 1 to 2 days after your injury.  Difficulty standing or walking, particularly when turning corners or changing directions. DIAGNOSIS  Your caregiver will ask you details about your injury and perform a physical exam of your ankle to determine if you have an ankle sprain. During the physical exam, your caregiver will press on and apply pressure to specific areas of your foot and ankle. Your caregiver will try to move your ankle in certain ways. An X-ray exam may be done to be sure a bone was not broken or a ligament did not separate from one of the bones in your ankle (avulsion fracture).  TREATMENT  Certain types of braces can help stabilize your ankle. Your caregiver can make a recommendation for this. Your caregiver may recommend the use of medicine for pain. If your sprain is severe, your caregiver may refer you to a surgeon who  helps to restore function to parts of your skeletal system (orthopedist) or a physical therapist. HOME CARE INSTRUCTIONS   Apply ice to your injury for 1-2 days or as directed by your caregiver. Applying ice helps to reduce inflammation and pain.  Put ice in a plastic bag.  Place a towel between your skin and the bag.  Leave the ice on for 15-20 minutes at a time, every 2 hours while you are awake.  Only take over-the-counter or prescription medicines for pain, discomfort, or fever as directed by your caregiver.  Elevate your injured ankle above the level of your heart as much as possible for 2-3 days.  If your caregiver recommends crutches, use them as instructed. Gradually put weight on the affected ankle. Continue to use crutches or a cane until you can walk without feeling pain in your ankle.  If you have a plaster splint, wear the splint as directed by your caregiver. Do not rest it on anything harder than a pillow for the first 24 hours. Do not put weight on it. Do not get it wet. You may take it off to take a shower or bath.  You may have been given an elastic bandage to wear around your ankle to provide support. If the elastic bandage is too tight (you have numbness or tingling in your foot or your foot becomes cold and blue), adjust the bandage to make it comfortable.  If you have an air splint, you may blow more air into it or let air out to make it  more comfortable. You may take your splint off at night and before taking a shower or bath. Wiggle your toes in the splint several times per day to decrease swelling. °SEEK MEDICAL CARE IF:  °· You have rapidly increasing bruising or swelling. °· Your toes feel extremely cold or you lose feeling in your foot. °· Your pain is not relieved with medicine. °SEEK IMMEDIATE MEDICAL CARE IF: °· Your toes are numb or blue. °· You have severe pain that is increasing. °MAKE SURE YOU:  °· Understand these instructions. °· Will watch your  condition. °· Will get help right away if you are not doing well or get worse. °Document Released: 08/22/2005 Document Revised: 05/16/2012 Document Reviewed: 09/03/2011 °[employer]® Patient Information ©2015 [employer]. This information is not intended to replace advice given to you by your health care provider. Make sure you discuss any questions you have with your health care provider. ° ° °

## 2015-01-09 ENCOUNTER — Ambulatory Visit: Payer: Self-pay | Admitting: Internal Medicine

## 2015-11-03 ENCOUNTER — Emergency Department (HOSPITAL_BASED_OUTPATIENT_CLINIC_OR_DEPARTMENT_OTHER): Payer: Self-pay

## 2015-11-03 ENCOUNTER — Emergency Department (HOSPITAL_BASED_OUTPATIENT_CLINIC_OR_DEPARTMENT_OTHER)
Admission: EM | Admit: 2015-11-03 | Discharge: 2015-11-03 | Disposition: A | Discharge status: Home / Self Care | Payer: Self-pay | Attending: Emergency Medicine | Admitting: Emergency Medicine

## 2015-11-03 ENCOUNTER — Encounter (HOSPITAL_BASED_OUTPATIENT_CLINIC_OR_DEPARTMENT_OTHER): Payer: Self-pay | Admitting: *Deleted

## 2015-11-03 DIAGNOSIS — Z880 Allergy status to penicillin: Secondary | ICD-10-CM | POA: Insufficient documentation

## 2015-11-03 DIAGNOSIS — N644 Mastodynia: Secondary | ICD-10-CM | POA: Insufficient documentation

## 2015-11-03 DIAGNOSIS — R208 Other disturbances of skin sensation: Secondary | ICD-10-CM | POA: Insufficient documentation

## 2015-11-03 DIAGNOSIS — R238 Other skin changes: Secondary | ICD-10-CM

## 2015-11-03 DIAGNOSIS — Z3202 Encounter for pregnancy test, result negative: Secondary | ICD-10-CM | POA: Insufficient documentation

## 2015-11-03 DIAGNOSIS — Z87891 Personal history of nicotine dependence: Secondary | ICD-10-CM | POA: Insufficient documentation

## 2015-11-03 LAB — URINALYSIS, ROUTINE W REFLEX MICROSCOPIC
Bilirubin Urine: NEGATIVE
Glucose, UA: NEGATIVE mg/dL
Ketones, ur: NEGATIVE mg/dL
Leukocytes, UA: NEGATIVE
NITRITE: NEGATIVE
Protein, ur: NEGATIVE mg/dL
SPECIFIC GRAVITY, URINE: 1.012 (ref 1.005–1.030)
pH: 7.5 (ref 5.0–8.0)

## 2015-11-03 LAB — URINE MICROSCOPIC-ADD ON

## 2015-11-03 LAB — PREGNANCY, URINE: PREG TEST UR: NEGATIVE

## 2015-11-03 MED ORDER — SODIUM CHLORIDE 0.9 % IV BOLUS (SEPSIS): 1000.0000 mL | Freq: Once | INTRAVENOUS | Status: DC

## 2015-11-03 MED ORDER — HYDROMORPHONE HCL 1 MG/ML IJ SOLN
1.0000 mg | Freq: Once | INTRAMUSCULAR | Status: AC
  Administered 2015-11-03: 1 mg via INTRAMUSCULAR
  Filled 2015-11-03: qty 1

## 2015-11-03 MED ORDER — VALACYCLOVIR HCL 1 G PO TABS: 1000.0000 mg | ORAL_TABLET | Freq: Three times a day (TID) | ORAL | Status: AC

## 2015-11-03 MED ORDER — OXYCODONE-ACETAMINOPHEN 5-325 MG PO TABS: 1.0000 | ORAL_TABLET | Freq: Four times a day (QID) | ORAL | Status: DC | PRN

## 2015-11-03 NOTE — ED Notes (Signed)
Sharp pain in her left mid abdomen x 3 days. Bloated and tightness.

## 2015-11-03 NOTE — Discharge Instructions (Signed)
Return here as needed.  Follow-up with your primary care doctor °

## 2015-11-03 NOTE — ED Notes (Signed)
PA at bedside.

## 2015-11-03 NOTE — ED Provider Notes (Signed)
CSN: [id number]     Arrival date & time 11/03/15  1654 History   First MD Initiated Contact with Patient 11/03/15 1951     Chief Complaint  Patient presents with  . Abdominal Pain     (Consider location/radiation/quality/duration/timing/severity/associated sxs/prior Treatment) HPI Patient presents to the emergency department with pain that starts along her skin underneath her breast and wraps around to the back towards her spine that it feels like a burning hot sensation.  Her skin is very sensitive to palpation and movement.  Patient states that coughing also makes it worse.  Patient denies shortness of breath, nausea, vomiting, weakness, dizziness, headache, blurred vision, back pain, neck pain, fever, dysuria, incontinence, bloody stool, hematemesis, abdominal pain, near syncope or syncope.  Patient states she did not take any medication prior to arrival History reviewed. No pertinent past medical history. Past Surgical History  Procedure Laterality Date  . Cholecystectomy     Family History  Problem Relation Age of Onset  . Hypertension Other   . Diabetes Other   . COPD Other    Social History  Substance Use Topics  . Smoking status: Former Games developer  . Smokeless tobacco: Never Used  . Alcohol Use: No   OB History    No data available     Review of Systems  All other systems negative except as documented in the HPI. All pertinent positives and negatives as reviewed in the HPI.  Allergies  Amoxicillin; Ketorolac; Kiwi extract; Other; Penicillins; and Tramadol  Home Medications   Prior to Admission medications   Medication Sig Start Date End Date Taking? Authorizing Provider  acetaminophen (TYLENOL) 500 MG tablet Take 1,000 mg by mouth every 6 (six) hours as needed (pain.).    Historical Provider, MD  cyclobenzaprine (FLEXERIL) 10 MG tablet Take 1 tablet (10 mg total) by mouth 2 (two) times daily as needed for muscle spasms. Patient not taking: Reported on 12/09/2014  07/08/14   Elpidio Anis, PA-C  diazepam (VALIUM) 5 MG tablet Take 1 tablet (5 mg total) by mouth every 8 (eight) hours as needed for muscle spasms. Patient not taking: Reported on 12/09/2014 03/12/14   Layla Maw Ward, DO  HYDROcodone-acetaminophen (NORCO/VICODIN) 5-325 MG per tablet Take 1-2 tablets every 6 hours as needed for severe pain Patient not taking: Reported on 12/09/2014 10/18/14   Renne Crigler, PA-C  HYDROcodone-homatropine (HYDROMET) 5-1.5 MG/5ML syrup Take 5 mLs by mouth every 6 (six) hours as needed for cough. Patient not taking: Reported on 12/09/2014 07/03/13   Rolan Bucco, MD  ibuprofen (ADVIL,MOTRIN) 200 MG tablet Take 400 mg by mouth every 6 (six) hours as needed. For pain    Historical Provider, MD  ibuprofen (ADVIL,MOTRIN) 800 MG tablet Take 1 tablet (800 mg total) by mouth every 8 (eight) hours as needed for mild pain. 03/12/14   Kristen N Ward, DO  oxyCODONE-acetaminophen (PERCOCET/ROXICET) 5-325 MG per tablet Take 1 tablet by mouth every 4 (four) hours as needed. Patient not taking: Reported on 12/09/2014 03/12/14   Layla Maw Ward, DO  predniSONE (DELTASONE) 20 MG tablet 3 tabs po day one, then 2 po daily x 4 days Patient not taking: Reported on 12/09/2014 07/03/13   Rolan Bucco, MD   BP 126/90 mmHg  Pulse 88  Temp(Src) 98.4 F (36.9 C) (Oral)  Resp 18  SpO2 95%  LMP 10/13/2015 Physical Exam  Constitutional: She appears well-developed and well-nourished. No distress.  HENT:  Head: Normocephalic and atraumatic.  Mouth/Throat: Oropharynx is clear and moist.  Eyes: Pupils are equal, round, and reactive to light.  Neck: Normal range of motion. Neck supple.  Cardiovascular: Normal rate, regular rhythm and normal heart sounds.  Exam reveals no gallop and no friction rub.   No murmur heard. Pulmonary/Chest: Effort normal and breath sounds normal. No respiratory distress. She has no wheezes. She exhibits tenderness.    Abdominal: Soft. Bowel sounds are normal. She exhibits no  distension. There is no tenderness. There is no guarding.  Skin: Skin is warm and dry. No rash noted. No erythema.  Nursing note and vitals reviewed.   ED Course  Procedures (including critical care time) Labs Review Labs Reviewed  URINALYSIS, ROUTINE W REFLEX MICROSCOPIC (NOT AT [facility]) - Abnormal; Notable for the following:    Hgb urine dipstick TRACE (*)    All other components within normal limits  URINE MICROSCOPIC-ADD ON - Abnormal; Notable for the following:    Squamous Epithelial / LPF 0-5 (*)    Bacteria, UA RARE (*)    All other components within normal limits  PREGNANCY, URINE    Imaging Review Dg Chest 2 View  11/03/2015  CLINICAL DATA:  Chest pain and cough for 3 days EXAM: CHEST  2 VIEW COMPARISON:  July 03, 2013 FINDINGS: There is no edema or consolidation. Heart size and pulmonary vascularity are normal. No adenopathy. No pneumothorax. No bone lesions. IMPRESSION: No edema or consolidation. Electronically Signed   By: Bretta Bang III M.D.   On: 11/03/2015 20:58   I have personally reviewed and evaluated these images and lab results as part of my medical decision-making.   The patient has symptoms that are consistent with an early herpes zoster.  I cannot prove that this is the case, but we will treat for this.  The patient does not have any other symptoms that would be concerning for any cardiac or pulmonary issues.  The patient is very specifically tender on the skin in the dermatome just under her left breast.  There is some area of redness but is not a definitive rash this time.   Charlestine Night, PA-C 11/03/15 2159  Charlestine Night, PA-C 11/03/15 2206  Laurence Spates, MD 11/03/15 [phone]

## 2017-06-23 ENCOUNTER — Emergency Department (HOSPITAL_BASED_OUTPATIENT_CLINIC_OR_DEPARTMENT_OTHER)
Admission: EM | Admit: 2017-06-23 | Discharge: 2017-06-23 | Disposition: A | Discharge status: Home / Self Care | Payer: Self-pay | Attending: Emergency Medicine | Admitting: Emergency Medicine

## 2017-06-23 ENCOUNTER — Encounter (HOSPITAL_BASED_OUTPATIENT_CLINIC_OR_DEPARTMENT_OTHER): Payer: Self-pay | Admitting: *Deleted

## 2017-06-23 DIAGNOSIS — K047 Periapical abscess without sinus: Secondary | ICD-10-CM | POA: Insufficient documentation

## 2017-06-23 DIAGNOSIS — Z87891 Personal history of nicotine dependence: Secondary | ICD-10-CM | POA: Insufficient documentation

## 2017-06-23 MED ORDER — CLINDAMYCIN HCL 150 MG PO CAPS
300.0000 mg | ORAL_CAPSULE | Freq: Once | ORAL | Status: AC
  Administered 2017-06-23: 300 mg via ORAL
  Filled 2017-06-23: qty 2

## 2017-06-23 MED ORDER — CLINDAMYCIN HCL 300 MG PO CAPS: 300.0000 mg | ORAL_CAPSULE | Freq: Four times a day (QID) | ORAL | 0 refills | Status: DC

## 2017-06-23 MED ORDER — HYDROCODONE-ACETAMINOPHEN 5-325 MG PO TABS
2.0000 | ORAL_TABLET | Freq: Once | ORAL | Status: AC
  Administered 2017-06-23: 2 via ORAL
  Filled 2017-06-23: qty 2

## 2017-06-23 MED ORDER — HYDROCODONE-ACETAMINOPHEN 5-325 MG PO TABS: ORAL_TABLET | ORAL | 0 refills | Status: DC

## 2017-06-23 MED FILL — CLINDAMYCIN HCL 300 MG CAPS: 300 | 7 days supply | Qty: 28 | Fill #0

## 2017-06-23 MED FILL — HYDROCODON-APAP 5-325: 5-325 | 2 days supply | Qty: 10 | Fill #0

## 2017-06-23 NOTE — Discharge Instructions (Signed)
Take clindamycin as prescribed.   Take tylenol for pain.   Take vicodin for severe pain. DO not drive with it.   See your dentist as scheduled for your tooth extraction   Return to ER if you have worse facial swelling, trouble swallowing, fever, vomiting.

## 2017-06-23 NOTE — ED Triage Notes (Signed)
Dental pain. Woke with swelling in the right side of her face.

## 2017-06-23 NOTE — ED Provider Notes (Signed)
[facility] EMERGENCY DEPARTMENT Provider Note   CSN: [id number] Arrival date & time: 06/23/17  1106     History   Chief Complaint Chief Complaint  Patient presents with  . Dental Pain    HPI [name] is a 41 y.o. female history of dental caries here presenting with swelling on the right face. Patient states that she has poor dentition at baseline and is supposed to get all her teeth pulled and have dentures. She woke up this morning with swelling of the right face. Denies any fevers or chills and denies any trouble swallowing. Patient called her dentist and does not have an appointment for another 2 weeks. Also did not have any pain medicine at home.  The history is provided by the patient.    History reviewed. No pertinent past medical history.  There are no active problems to display for this patient.   Past Surgical History:  Procedure Laterality Date  . CHOLECYSTECTOMY      OB History    No data available       Home Medications    Prior to Admission medications   Medication Sig Start Date End Date Taking? Authorizing Provider  acetaminophen (TYLENOL) 500 MG tablet Take 1,000 mg by mouth every 6 (six) hours as needed (pain.).   Yes [provider]  ibuprofen (ADVIL,MOTRIN) 200 MG tablet Take 400 mg by mouth every 6 (six) hours as needed. For pain   Yes [provider]  cyclobenzaprine (FLEXERIL) 10 MG tablet Take 1 tablet (10 mg total) by mouth 2 (two) times daily as needed for muscle spasms. Patient not taking: Reported on 12/09/2014 07/08/14   Elpidio AnisUpstill, Shari, PA-C  diazepam (VALIUM) 5 MG tablet Take 1 tablet (5 mg total) by mouth every 8 (eight) hours as needed for muscle spasms. Patient not taking: Reported on 12/09/2014 03/12/14   Ward, Layla MawKristen N, DO  HYDROcodone-acetaminophen (NORCO/VICODIN) 5-325 MG per tablet Take 1-2 tablets every 6 hours as needed for severe pain Patient not taking: Reported on 12/09/2014 10/18/14   Renne CriglerGeiple,  Joshua, PA-C  HYDROcodone-homatropine (HYDROMET) 5-1.5 MG/5ML syrup Take 5 mLs by mouth every 6 (six) hours as needed for cough. Patient not taking: Reported on 12/09/2014 07/03/13   Rolan BuccoBelfi, Melanie, MD  ibuprofen (ADVIL,MOTRIN) 800 MG tablet Take 1 tablet (800 mg total) by mouth every 8 (eight) hours as needed for mild pain. 03/12/14   Ward, Layla MawKristen N, DO  oxyCODONE-acetaminophen (PERCOCET/ROXICET) 5-325 MG tablet Take 1 tablet by mouth every 6 (six) hours as needed for severe pain. 11/03/15   Lawyer, Cristal Deerhristopher, PA-C  predniSONE (DELTASONE) 20 MG tablet 3 tabs po day one, then 2 po daily x 4 days Patient not taking: Reported on 12/09/2014 07/03/13   Rolan BuccoBelfi, Melanie, MD    Family History Family History  Problem Relation Age of Onset  . Hypertension Other   . Diabetes Other   . COPD Other     Social History Social History  Substance Use Topics  . Smoking status: Former Games developermoker  . Smokeless tobacco: Never Used  . Alcohol use No     Allergies   Amoxicillin; Ketorolac; Kiwi extract; Other; Penicillins; and Tramadol   Review of Systems Review of Systems  HENT: Positive for dental problem.   All other systems reviewed and are negative.    Physical Exam Updated Vital Signs BP (!) 130/92   Pulse 86   Temp 98 F (36.7 C) (Oral)   Resp 16   Ht 5' 1.5" (  1.562 m)   Wt (!) 152.4 kg (336 lb)   LMP 06/05/2017   SpO2 98%   BMI 62.46 kg/m   Physical Exam  Constitutional:  Uncomfortable   HENT:  Head: Normocephalic.  Mild R mid face swelling. No parotid tenderness. There is poor dentition and multiple dental caries. Mild swelling R upper gums around the canines. No obvious purulent discharge. Floor of mouth soft and nontender   Eyes: Pupils are equal, round, and reactive to light. Conjunctivae and EOM are normal.  Neck: Normal range of motion. Neck supple.  Cardiovascular: Normal rate, regular rhythm and normal heart sounds.   Pulmonary/Chest: Effort normal and breath sounds normal.  No respiratory distress.  Abdominal: Soft. Bowel sounds are normal. She exhibits no distension. There is no tenderness.  Musculoskeletal: Normal range of motion.  Neurological: She is alert.  Skin: Skin is warm.  Psychiatric: She has a normal mood and affect.  Nursing note and vitals reviewed.    ED Treatments / Results  Labs (all labs ordered are listed, but only abnormal results are displayed) Labs Reviewed - No data to display  EKG  EKG Interpretation None       Radiology No results found.  Procedures Procedures (including critical care time) EMERGENCY DEPARTMENT US SOFT TISSUE INTERPRETATION "Study: Limited Soft Tissue Ultrasound"  INDICATIONS: Pain Multiple views of the body part were obtained in real-time with a multi-frequency linear probe  PERFORMED BY: Myself IMAGES ARCHIVED?: Yes SIDE:Right  BODY PART:face INTERPRETATION:  No abcess noted, there is swelling around the gum line, no obvious large abscess collection       Medications Ordered in ED Medications  HYDROcodone-acetaminophen (NORCO/VICODIN) 5-325 MG per tablet 2 tablet (not administered)  clindamycin (CLEOCIN) capsule 300 mg (not administered)     Initial Impression / Assessment and Plan / ED Course  I have reviewed the triage vital signs and the nursing notes.  Pertinent labs & imaging results that were available during my care of the patient were reviewed by me and considered in my medical decision making (see chart for details).     [name] is a 41 y.o. female here with R facial swelling. Has poor dentition, swelling around the R upper incisor area. Bedside US showed diffuse tissue swelling, ? Small pocket but not large enough to I and D. She has multiple drug allergies so will give clind, vicodin. Has dental follow up in 2 weeks. Gave strict return precautions    Final Clinical Impressions(s) / ED Diagnoses   Final diagnoses:  None    New Prescriptions New Prescriptions     No medications on file     Charlynne Pander, MD 06/23/17 1135

## 2017-07-09 ENCOUNTER — Emergency Department (HOSPITAL_BASED_OUTPATIENT_CLINIC_OR_DEPARTMENT_OTHER)
Admission: EM | Admit: 2017-07-09 | Discharge: 2017-07-09 | Disposition: A | Discharge status: Home / Self Care | Payer: Self-pay | Attending: Emergency Medicine | Admitting: Emergency Medicine

## 2017-07-09 ENCOUNTER — Emergency Department (HOSPITAL_BASED_OUTPATIENT_CLINIC_OR_DEPARTMENT_OTHER): Payer: Self-pay

## 2017-07-09 ENCOUNTER — Encounter (HOSPITAL_BASED_OUTPATIENT_CLINIC_OR_DEPARTMENT_OTHER): Payer: Self-pay | Admitting: *Deleted

## 2017-07-09 DIAGNOSIS — S93402A Sprain of unspecified ligament of left ankle, initial encounter: Secondary | ICD-10-CM | POA: Insufficient documentation

## 2017-07-09 DIAGNOSIS — W19XXXA Unspecified fall, initial encounter: Secondary | ICD-10-CM | POA: Insufficient documentation

## 2017-07-09 DIAGNOSIS — Y929 Unspecified place or not applicable: Secondary | ICD-10-CM | POA: Insufficient documentation

## 2017-07-09 DIAGNOSIS — Y939 Activity, unspecified: Secondary | ICD-10-CM | POA: Insufficient documentation

## 2017-07-09 DIAGNOSIS — Y998 Other external cause status: Secondary | ICD-10-CM | POA: Insufficient documentation

## 2017-07-09 DIAGNOSIS — Z87891 Personal history of nicotine dependence: Secondary | ICD-10-CM | POA: Insufficient documentation

## 2017-07-09 MED ORDER — ACETAMINOPHEN 500 MG PO TABS
1000.0000 mg | ORAL_TABLET | Freq: Once | ORAL | Status: AC
  Administered 2017-07-09: 1000 mg via ORAL
  Filled 2017-07-09: qty 2

## 2017-07-09 MED ORDER — ACETAMINOPHEN 500 MG PO TABS: 1000.0000 mg | ORAL_TABLET | Freq: Three times a day (TID) | ORAL | 0 refills | Status: AC | PRN

## 2017-07-09 NOTE — ED Provider Notes (Signed)
[facility] EMERGENCY DEPARTMENT Provider Note  CSN: [id number] Arrival date & time: 07/09/17 1906  Chief Complaint(s) Ankle Pain  HPI [name] is a 41 y.o. female   The history is provided by the patient.  Ankle Pain   The incident occurred 1 to 2 hours ago. The incident occurred at home. The injury mechanism was a fall. The pain is present in the left ankle. The quality of the pain is described as aching. The pain is severe. The pain has been constant since onset. Associated symptoms include inability to bear weight. Pertinent negatives include no numbness, no loss of motion, no loss of sensation and no tingling. She reports no foreign bodies present. The symptoms are aggravated by activity, bearing weight and palpation. She has tried nothing for the symptoms.    Past Medical History History reviewed. No pertinent past medical history. There are no active problems to display for this patient.  Home Medication(s) Prior to Admission medications   Medication Sig Start Date End Date Taking? Authorizing Provider  acetaminophen (TYLENOL) 500 MG tablet Take 2 tablets (1,000 mg total) every 8 (eight) hours as needed by mouth (pain.). 07/09/17   CardamaAmadeo Garnet, MD  clindamycin (CLEOCIN) 300 MG capsule Take 1 capsule (300 mg total) by mouth 4 (four) times daily. X 7 days 06/23/17   Charlynne Pander, MD  cyclobenzaprine (FLEXERIL) 10 MG tablet Take 1 tablet (10 mg total) by mouth 2 (two) times daily as needed for muscle spasms. Patient not taking: Reported on 12/09/2014 07/08/14   Elpidio Anis, PA-C  diazepam (VALIUM) 5 MG tablet Take 1 tablet (5 mg total) by mouth every 8 (eight) hours as needed for muscle spasms. Patient not taking: Reported on 12/09/2014 03/12/14   Ward, Layla Maw, DO  HYDROcodone-acetaminophen (NORCO/VICODIN) 5-325 MG tablet Take 1-2 tablets every 6 hours as needed for severe pain 06/23/17   Charlynne Pander, MD  HYDROcodone-homatropine (HYDROMET)  5-1.5 MG/5ML syrup Take 5 mLs by mouth every 6 (six) hours as needed for cough. Patient not taking: Reported on 12/09/2014 07/03/13   Rolan Bucco, MD  ibuprofen (ADVIL,MOTRIN) 200 MG tablet Take 400 mg by mouth every 6 (six) hours as needed. For pain    [provider]  ibuprofen (ADVIL,MOTRIN) 800 MG tablet Take 1 tablet (800 mg total) by mouth every 8 (eight) hours as needed for mild pain. 03/12/14   Ward, Layla Maw, DO  oxyCODONE-acetaminophen (PERCOCET/ROXICET) 5-325 MG tablet Take 1 tablet by mouth every 6 (six) hours as needed for severe pain. 11/03/15   Lawyer, Cristal Deer, PA-C  predniSONE (DELTASONE) 20 MG tablet 3 tabs po day one, then 2 po daily x 4 days Patient not taking: Reported on 12/09/2014 07/03/13   Rolan Bucco, MD  Past Surgical History Past Surgical History:  Procedure Laterality Date  . CHOLECYSTECTOMY     Family History Family History  Problem Relation Age of Onset  . Hypertension Other   . Diabetes Other   . COPD Other     Social History Social History   Tobacco Use  . Smoking status: Former Games developer  . Smokeless tobacco: Never Used  Substance Use Topics  . Alcohol use: No  . Drug use: No   Allergies Amoxicillin; Ketorolac; Kiwi extract; Other; Penicillins; and Tramadol  Review of Systems Review of Systems  Neurological: Negative for tingling and numbness.   All other systems are reviewed and are negative for acute change except as noted in the HPI  Physical Exam Vital Signs  I have reviewed the triage vital signs BP (!) 153/95 (BP Location: Right Arm)   Pulse 82   Temp 97.6 F (36.4 C) (Oral)   Resp 20   Ht 5' 1.5" (1.562 m)   Wt (!) 152.4 kg (336 lb)   SpO2 100%   BMI 62.46 kg/m   Physical Exam  Constitutional: She is oriented to person, place, and time. She appears well-developed and well-nourished. No  distress.  HENT:  Head: Normocephalic and atraumatic.  Right Ear: External ear normal.  Left Ear: External ear normal.  Nose: Nose normal.  Eyes: Conjunctivae and EOM are normal. Pupils are equal, round, and reactive to light. Right eye exhibits no discharge. Left eye exhibits no discharge. No scleral icterus.  Neck: Normal range of motion and phonation normal. Neck supple.  Cardiovascular: Normal rate, regular rhythm and normal heart sounds. Exam reveals no gallop and no friction rub.  No murmur heard. Pulses:      Radial pulses are 2+ on the right side, and 2+ on the left side.       Dorsalis pedis pulses are 2+ on the right side, and 2+ on the left side.  Pulmonary/Chest: Effort normal and breath sounds normal. No stridor. No respiratory distress. She has no wheezes.  Abdominal: Soft. She exhibits no distension. There is no tenderness.  Musculoskeletal: She exhibits no edema.       Left ankle: She exhibits decreased range of motion and swelling. She exhibits no deformity. Tenderness. Lateral malleolus, medial malleolus, head of 5th metatarsal and proximal fibula tenderness found.       Cervical back: She exhibits no bony tenderness.       Thoracic back: She exhibits no bony tenderness.       Lumbar back: She exhibits no bony tenderness.       Left foot: There is bony tenderness. There is no deformity.  Clavicles stable. Chest stable to AP/Lat compression. Pelvis stable to Lat compression. No obvious extremity deformity. No chest or abdominal wall contusion.  Neurological: She is alert and oriented to person, place, and time.  Moving all extremities  Skin: Skin is warm and dry. No rash noted. She is not diaphoretic. No erythema.  Psychiatric: She has a normal mood and affect. Her behavior is normal.  Vitals reviewed.   ED Results and Treatments Labs (all labs ordered are listed, but only abnormal results are displayed) Labs Reviewed - No data to display  EKG  EKG Interpretation  Date/Time:    Ventricular Rate:    PR Interval:    QRS Duration:   QT Interval:    QTC Calculation:   R Axis:     Text Interpretation:        Radiology Dg Ankle Complete Left  Result Date: 07/09/2017 CLINICAL DATA:  Patient with ankle injury.  Initial encounter. EXAM: LEFT ANKLE COMPLETE - 3+ VIEW COMPARISON:  None. FINDINGS: Normal anatomic alignment. No evidence for acute fracture or dislocation. Talar dome is intact. Midfoot degenerative changes. Plantar calcaneal spurring. IMPRESSION: No acute osseous abnormality. Electronically Signed   By: Annia Beltrew  Davis M.D.   On: 07/09/2017 20:32   Dg Foot Complete Left  Result Date: 07/09/2017 CLINICAL DATA:  Patient with ankle injury.  Initial encounter. EXAM: LEFT FOOT - COMPLETE 3+ VIEW COMPARISON:  None. FINDINGS: Normal anatomic alignment. No evidence for acute fracture or dislocation. Midfoot degenerative changes. Plantar and posterior calcaneal spurring. IMPRESSION: No acute osseous abnormality. Electronically Signed   By: Annia Beltrew  Davis M.D.   On: 07/09/2017 20:35   Pertinent labs & imaging results that were available during my care of the patient were reviewed by me and considered in my medical decision making (see chart for details).  Medications Ordered in ED Medications  acetaminophen (TYLENOL) tablet 1,000 mg (1,000 mg Oral Given 07/09/17 1932)                                                                                                                                    Procedures Procedures  (including critical care time)  Medical Decision Making / ED Course I have reviewed the nursing notes for this encounter and the patient's prior records (if available in EHR or on provided paperwork).    Mechanical fall from standing resulting in left ankle and foot pain.  Plain film without evidence of acute fracture or  dislocation.  No evidence of Lisfranc.  Patient provided with oral pain medication and Cam walker. The patient is safe for discharge with strict return precautions.   Final Clinical Impression(s) / ED Diagnoses Final diagnoses:  Fall  Sprain of left ankle, unspecified ligament, initial encounter   Disposition: Discharge  Condition: Good  I have discussed the results, Dx and Tx plan with the patient who expressed understanding and agree(s) with the plan. Discharge instructions discussed at great length. The patient was given strict return precautions who verbalized understanding of the instructions. No further questions at time of discharge.     Follow Up: Primary care provider  Schedule an appointment as soon as possible for a visit  As needed      This chart was dictated using voice recognition software.  Despite best efforts to proofread,  errors can occur which can change the documentation meaning.   Nira Connardama, Klayten Jolliff Eduardo, MD 07/09/17 2042

## 2017-07-09 NOTE — ED Notes (Signed)
PMS intact before and after. Pt tolerated well. All questions answered. 

## 2017-07-09 NOTE — ED Notes (Signed)
Patient transported to X-ray 

## 2017-07-09 NOTE — ED Triage Notes (Signed)
Pt states she was getting on a step, missed the step and turned her left ankle. She has pain and swelling to the left ankle. 800mg  Ibuprofen taken just PTA.

## 2017-08-01 ENCOUNTER — Emergency Department (HOSPITAL_BASED_OUTPATIENT_CLINIC_OR_DEPARTMENT_OTHER): Payer: Self-pay

## 2017-08-01 ENCOUNTER — Encounter (HOSPITAL_BASED_OUTPATIENT_CLINIC_OR_DEPARTMENT_OTHER): Payer: Self-pay

## 2017-08-01 ENCOUNTER — Emergency Department (HOSPITAL_BASED_OUTPATIENT_CLINIC_OR_DEPARTMENT_OTHER)
Admission: EM | Admit: 2017-08-01 | Discharge: 2017-08-01 | Disposition: A | Discharge status: Home / Self Care | Payer: Self-pay | Attending: Physician Assistant | Admitting: Physician Assistant

## 2017-08-01 DIAGNOSIS — Z87891 Personal history of nicotine dependence: Secondary | ICD-10-CM | POA: Insufficient documentation

## 2017-08-01 DIAGNOSIS — J40 Bronchitis, not specified as acute or chronic: Secondary | ICD-10-CM | POA: Insufficient documentation

## 2017-08-01 DIAGNOSIS — Z79899 Other long term (current) drug therapy: Secondary | ICD-10-CM | POA: Insufficient documentation

## 2017-08-01 MED ORDER — IPRATROPIUM-ALBUTEROL 0.5-2.5 (3) MG/3ML IN SOLN
3.0000 mL | Freq: Four times a day (QID) | RESPIRATORY_TRACT | Status: DC
  Administered 2017-08-01: 3 mL via RESPIRATORY_TRACT
  Filled 2017-08-01: qty 3

## 2017-08-01 MED ORDER — PROMETHAZINE-DM 6.25-15 MG/5ML PO SYRP: 5.0000 mL | ORAL_SOLUTION | Freq: Four times a day (QID) | ORAL | 0 refills | Status: AC | PRN

## 2017-08-01 MED ORDER — AEROCHAMBER PLUS FLO-VU MEDIUM MISC
1.0000 | Freq: Once | Status: AC
  Administered 2017-08-01: 1
  Filled 2017-08-01: qty 1

## 2017-08-01 MED ORDER — ALBUTEROL SULFATE HFA 108 (90 BASE) MCG/ACT IN AERS
2.0000 | INHALATION_SPRAY | RESPIRATORY_TRACT | Status: DC | PRN
  Administered 2017-08-01: 2 via RESPIRATORY_TRACT
  Filled 2017-08-01: qty 6.7

## 2017-08-01 MED FILL — PROMETHAZINE-DM SYRUP: 6.25-15 | 6 days supply | Qty: 118 | Fill #0

## 2017-08-01 NOTE — ED Triage Notes (Signed)
Pt reports cough x 1 week; denies nasal congestion, fevers, CP, earache. Pt report she also has ribcage pain when coughing. Pain rated 7/10.

## 2017-08-01 NOTE — ED Notes (Signed)
ED Provider at bedside. 

## 2017-08-01 NOTE — ED Provider Notes (Signed)
[facility] EMERGENCY DEPARTMENT Provider Note   CSN: [id number] Arrival date & time: 08/01/17  1050     History   Chief Complaint Chief Complaint  Patient presents with  . Cough    HPI [name] is a 41 y.o. female no significant past medical history presenting with one and half week of persistent nonproductive cough. She denies any fever, chills, congestion, nausea, vomiting, or any other symptoms. No known ill contacts. she is not up-to-date with her flu immunizations. She is a former smoker quit 6 years ago. She has not tried any medications for her symptoms, she reports associated rib pain from coughing.  HPI  History reviewed. No pertinent past medical history.  There are no active problems to display for this patient.   Past Surgical History:  Procedure Laterality Date  . CHOLECYSTECTOMY      OB History    No data available       Home Medications    Prior to Admission medications   Medication Sig Start Date End Date Taking? Authorizing Provider  acetaminophen (TYLENOL) 500 MG tablet Take 2 tablets (1,000 mg total) every 8 (eight) hours as needed by mouth (pain.). 07/09/17   CardamaAmadeo Garnet, Pedro Eduardo, MD  clindamycin (CLEOCIN) 300 MG capsule Take 1 capsule (300 mg total) by mouth 4 (four) times daily. X 7 days 06/23/17   Charlynne PanderYao, David Hsienta, MD  cyclobenzaprine (FLEXERIL) 10 MG tablet Take 1 tablet (10 mg total) by mouth 2 (two) times daily as needed for muscle spasms. Patient not taking: Reported on 12/09/2014 07/08/14   Elpidio AnisUpstill, Shari, PA-C  diazepam (VALIUM) 5 MG tablet Take 1 tablet (5 mg total) by mouth every 8 (eight) hours as needed for muscle spasms. Patient not taking: Reported on 12/09/2014 03/12/14   Ward, Layla MawKristen N, DO  HYDROcodone-acetaminophen (NORCO/VICODIN) 5-325 MG tablet Take 1-2 tablets every 6 hours as needed for severe pain 06/23/17   Charlynne PanderYao, David Hsienta, MD  HYDROcodone-homatropine (HYDROMET) 5-1.5 MG/5ML syrup Take 5 mLs by mouth every  6 (six) hours as needed for cough. Patient not taking: Reported on 12/09/2014 07/03/13   Rolan BuccoBelfi, Melanie, MD  ibuprofen (ADVIL,MOTRIN) 200 MG tablet Take 400 mg by mouth every 6 (six) hours as needed. For pain    [provider]  ibuprofen (ADVIL,MOTRIN) 800 MG tablet Take 1 tablet (800 mg total) by mouth every 8 (eight) hours as needed for mild pain. 03/12/14   Ward, Layla MawKristen N, DO  oxyCODONE-acetaminophen (PERCOCET/ROXICET) 5-325 MG tablet Take 1 tablet by mouth every 6 (six) hours as needed for severe pain. 11/03/15   Lawyer, Cristal Deerhristopher, PA-C  predniSONE (DELTASONE) 20 MG tablet 3 tabs po day one, then 2 po daily x 4 days Patient not taking: Reported on 12/09/2014 07/03/13   Rolan BuccoBelfi, Melanie, MD  promethazine-dextromethorphan (PROMETHAZINE-DM) 6.25-15 MG/5ML syrup Take 5 mLs by mouth 4 (four) times daily as needed for cough. 08/01/17   Georgiana ShoreMitchell, Hether Anselmo B, PA-C    Family History Family History  Problem Relation Age of Onset  . Hypertension Other   . Diabetes Other   . COPD Other     Social History Social History   Tobacco Use  . Smoking status: Former Games developermoker  . Smokeless tobacco: Never Used  Substance Use Topics  . Alcohol use: No  . Drug use: No     Allergies   Amoxicillin; Ketorolac; Kiwi extract; Other; Penicillins; and Tramadol   Review of Systems Review of Systems  Constitutional: Negative for chills and fever.  HENT: Negative for congestion, ear pain and sore throat.   Respiratory: Positive for cough and shortness of breath. Negative for choking, chest tightness, wheezing and stridor.   Cardiovascular: Negative for chest pain and palpitations.  Gastrointestinal: Negative for abdominal distention, abdominal pain, diarrhea, nausea and vomiting.  Genitourinary: Negative for difficulty urinating, dysuria and hematuria.  Musculoskeletal: Negative for arthralgias, back pain, gait problem, joint swelling, myalgias, neck pain and neck stiffness.  Skin: Negative for color  change, pallor, rash and wound.  Neurological: Negative for seizures and syncope.     Physical Exam Updated Vital Signs BP (!) 136/97 (BP Location: Right Arm)   Pulse 89   Temp 98.1 F (36.7 C) (Oral)   Resp (!) 24   Ht 5\' 1"  (1.549 m)   Wt (!) 152.4 kg (336 lb)   LMP 07/06/2017   SpO2 96%   BMI 63.49 kg/m   Physical Exam  Constitutional: She is oriented to person, place, and time. She appears well-developed and well-nourished. No distress.  Afebrile, nontoxic-appearing, sitting comfortably in bed in no acute distress  HENT:  Head: Normocephalic and atraumatic.  Mouth/Throat: Oropharynx is clear and moist. No oropharyngeal exudate.  Eyes: Conjunctivae and EOM are normal.  Neck: Normal range of motion.  Cardiovascular: Normal rate, regular rhythm and normal heart sounds.  No murmur heard. Pulmonary/Chest: Effort normal. No stridor. No respiratory distress. She has wheezes. She has no rales. She exhibits no tenderness.  Right upper lobe  Musculoskeletal: She exhibits no edema or deformity.  Neurological: She is alert and oriented to person, place, and time.  Skin: Skin is warm and dry. No rash noted. She is not diaphoretic. No erythema. No pallor.  Psychiatric: She has a normal mood and affect.  Nursing note and vitals reviewed.    ED Treatments / Results  Labs (all labs ordered are listed, but only abnormal results are displayed) Labs Reviewed - No data to display  EKG  EKG Interpretation None       Radiology Dg Chest 2 View  Result Date: 08/01/2017 CLINICAL DATA:  Cough EXAM: CHEST  2 VIEW COMPARISON:  11/03/2015 FINDINGS: Mild peribronchial thickening. Heart and mediastinal contours are within normal limits. No focal opacities or effusions. No acute bony abnormality. IMPRESSION: Mild bronchitic changes Electronically Signed   By: Charlett NoseKevin  Dover M.D.   On: 08/01/2017 11:44    Procedures Procedures (including critical care time)  Medications Ordered in  ED Medications  ipratropium-albuterol (DUONEB) 0.5-2.5 (3) MG/3ML nebulizer solution 3 mL (3 mLs Nebulization Given 08/01/17 1206)  albuterol (PROVENTIL HFA;VENTOLIN HFA) 108 (90 Base) MCG/ACT inhaler 2 puff (2 puffs Inhalation Given 08/01/17 1206)  AEROCHAMBER PLUS FLO-VU MEDIUM MISC 1 each (1 each Other Given 08/01/17 1206)     Initial Impression / Assessment and Plan / ED Course  I have reviewed the triage vital signs and the nursing notes.  Pertinent labs & imaging results that were available during my care of the patient were reviewed by me and considered in my medical decision making (see chart for details).     Patient presented with 1-1/2 week of persistent coughing without fever, chills congestion or other cold symptoms.  Chest x-ray with mild bronchitic changes, no evidence of pneumonia. Wheezing in the right upper lung fields on exam.  She was given nebulizing treatment while in the emergency department. She was provided with MDI and spacer and instructed on use.  Patient reported significant improvement after nebs. Lungs CTA bilaterally on reassessment. Normal; vitals and  stable, no hypoxia.  Will discharge home with symptomatic relief and close follow-up with PCP.  Discussed strict return precautions and advised to return to the emergency department if experiencing any new or worsening symptoms. Instructions were understood and patient agreed with discharge plan.  Final Clinical Impressions(s) / ED Diagnoses   Final diagnoses:  Bronchitis    ED Discharge Orders        Ordered    promethazine-dextromethorphan (PROMETHAZINE-DM) 6.25-15 MG/5ML syrup  4 times daily PRN     08/01/17 1158       Gregary Cromer 08/01/17 1313    Mackuen, Cindee Salt, MD 08/01/17 1534

## 2017-08-01 NOTE — Discharge Instructions (Signed)
As discussed, make sure you stay well-hydrated and take cough medicine as needed as well as alternating Tylenol and ibuprofen for pain. Follow up with a primary care provider at the wellness center.  Use your inhaler as needed and as instructed with the spacer.  Return sooner if you experience worsening symptoms or new concerning symptoms in the meantime.

## 2017-08-01 NOTE — ED Notes (Signed)
Family at bedside. 

## 2017-08-01 NOTE — ED Notes (Signed)
Respiratory at bedside.

## 2018-04-17 ENCOUNTER — Emergency Department (HOSPITAL_BASED_OUTPATIENT_CLINIC_OR_DEPARTMENT_OTHER)
Admission: EM | Admit: 2018-04-17 | Discharge: 2018-04-17 | Disposition: A | Discharge status: Home / Self Care | Payer: Self-pay | Attending: Emergency Medicine | Admitting: Emergency Medicine

## 2018-04-17 ENCOUNTER — Other Ambulatory Visit: Payer: Self-pay

## 2018-04-17 ENCOUNTER — Emergency Department (HOSPITAL_BASED_OUTPATIENT_CLINIC_OR_DEPARTMENT_OTHER): Payer: Self-pay

## 2018-04-17 ENCOUNTER — Encounter (HOSPITAL_BASED_OUTPATIENT_CLINIC_OR_DEPARTMENT_OTHER): Payer: Self-pay | Admitting: *Deleted

## 2018-04-17 DIAGNOSIS — S76011A Strain of muscle, fascia and tendon of right hip, initial encounter: Secondary | ICD-10-CM | POA: Insufficient documentation

## 2018-04-17 DIAGNOSIS — M25559 Pain in unspecified hip: Secondary | ICD-10-CM

## 2018-04-17 DIAGNOSIS — Y929 Unspecified place or not applicable: Secondary | ICD-10-CM | POA: Insufficient documentation

## 2018-04-17 DIAGNOSIS — Z87891 Personal history of nicotine dependence: Secondary | ICD-10-CM | POA: Insufficient documentation

## 2018-04-17 DIAGNOSIS — W19XXXA Unspecified fall, initial encounter: Secondary | ICD-10-CM | POA: Insufficient documentation

## 2018-04-17 DIAGNOSIS — Y939 Activity, unspecified: Secondary | ICD-10-CM | POA: Insufficient documentation

## 2018-04-17 DIAGNOSIS — Y998 Other external cause status: Secondary | ICD-10-CM | POA: Insufficient documentation

## 2018-04-17 MED ORDER — METHOCARBAMOL 500 MG PO TABS: 500.0000 mg | ORAL_TABLET | Freq: Three times a day (TID) | ORAL | 0 refills | Status: DC | PRN

## 2018-04-17 MED ORDER — METHOCARBAMOL 500 MG PO TABS
500.0000 mg | ORAL_TABLET | Freq: Once | ORAL | Status: AC
Start: 2018-04-17 — End: 2018-04-17
  Administered 2018-04-17: 500 mg via ORAL
  Filled 2018-04-17: qty 1

## 2018-04-17 NOTE — ED Triage Notes (Signed)
Pain in her right hip since falling 2 weeks ago.

## 2018-04-17 NOTE — ED Provider Notes (Signed)
Emergency Department Provider Note   I have reviewed the triage vital signs and the nursing notes.   HISTORY  Chief Complaint Hip Injury   HPI [name] is a 42 y.o. female presents to the emergency department for evaluation of right hip pain.  Patient has had pain for the past 2 weeks.  She has pain running from the buttocks, laterally, down the right thigh.  She is able to walk although it is painful.  She needs assistance with putting on her pants because of pain.  No prior symptoms like this in the past.  No radiation of pain past the knee.  No numbness or weakness in the right leg.    History reviewed. No pertinent past medical history.  There are no active problems to display for this patient.   Past Surgical History:  Procedure Laterality Date  . CHOLECYSTECTOMY      Allergies Amoxicillin; Ketorolac; Kiwi extract; Other; Penicillins; and Tramadol  Family History  Problem Relation Age of Onset  . Hypertension Other   . Diabetes Other   . COPD Other     Social History Social History   Tobacco Use  . Smoking status: Former Games developermoker  . Smokeless tobacco: Never Used  Substance Use Topics  . Alcohol use: No  . Drug use: No    Review of Systems  Constitutional: No fever/chills Eyes: No visual changes. ENT: No sore throat. Cardiovascular: Denies chest pain. Respiratory: Denies shortness of breath. Gastrointestinal: No abdominal pain.  No nausea, no vomiting.  No diarrhea.  No constipation. Genitourinary: Negative for dysuria. Musculoskeletal: Negative for back pain. Positive right leg/hip pain.  Skin: Negative for rash. Neurological: Negative for headaches, focal weakness or numbness.  10-point ROS otherwise negative.  ____________________________________________   PHYSICAL EXAM:  VITAL SIGNS: ED Triage Vitals  Enc Vitals Group     BP 04/17/18 1141 (!) 156/109     Pulse Rate 04/17/18 1141 79     Resp 04/17/18 1141 20     Temp 04/17/18  1141 97.8 F (36.6 C)     Temp Source 04/17/18 1141 Oral     SpO2 04/17/18 1141 100 %     Weight 04/17/18 1138 (!) 330 lb 7 oz (149.9 kg)     Height 04/17/18 1138 5' 1.5" (1.562 m)     Pain Score 04/17/18 1138 10   Constitutional: Alert and oriented. Well appearing and in no acute distress. Eyes: Conjunctivae are normal.  Head: Atraumatic. Nose: No congestion/rhinnorhea. Mouth/Throat: Mucous membranes are moist.  Neck: No stridor.  Cardiovascular: Normal rate, regular rhythm. Good peripheral circulation. Grossly normal heart sounds.   Respiratory: Normal respiratory effort.  No retractions. Lungs CTAB. Gastrointestinal: Soft and nontender. No distention.  Musculoskeletal: No lower extremity tenderness nor edema. No gross deformities of extremities. Neurologic:  Normal speech and language. No gross focal neurologic deficits are appreciated. Normal LE strength and sensation. Normal patellar reflexes.  Skin:  Skin is warm, dry and intact. No rash noted. ____________________________________________  RADIOLOGY  Dg Hip Unilat With Pelvis 2-3 Views Right  Result Date: 04/17/2018 CLINICAL DATA:  Right hip pain since a fall 1 week ago. Initial encounter. EXAM: DG HIP (WITH OR WITHOUT PELVIS) 2-3V RIGHT COMPARISON:  None. FINDINGS: There is no evidence of hip fracture or dislocation. There is no evidence of arthropathy or other focal bone abnormality. IMPRESSION: Negative exam. Electronically Signed   By: Drusilla Kannerhomas  Dalessio M.D.   On: 04/17/2018 12:14    ____________________________________________  PROCEDURES  Procedure(s) performed:   Procedures  None ____________________________________________   INITIAL IMPRESSION / ASSESSMENT AND PLAN / ED COURSE  Pertinent labs & imaging results that were available during my care of the patient were reviewed by me and considered in my medical decision making (see chart for details).  Patient presents to the ED with right hip pain. No concern  for septic joint. Suspect MSK etiology. X-ray negative for fracture or dislocation. Robaxin prescribed and advised warm compresses.   At this time, I do not feel there is any life-threatening condition present. I have reviewed and discussed all results (EKG, imaging, lab, urine as appropriate), exam findings with patient. I have reviewed nursing notes and appropriate previous records.  I feel the patient is safe to be discharged home without further emergent workup. Discussed usual and customary return precautions. Patient and family (if present) verbalize understanding and are comfortable with this plan.  Patient will follow-up with their primary care provider. If they do not have a primary care provider, information for follow-up has been provided to them. All questions have been answered.  ____________________________________________  FINAL CLINICAL IMPRESSION(S) / ED DIAGNOSES  Final diagnoses:  Hip strain, right, initial encounter     MEDICATIONS GIVEN DURING THIS VISIT:  Medications  methocarbamol (ROBAXIN) tablet 500 mg (500 mg Oral Given 04/17/18 1220)     NEW OUTPATIENT MEDICATIONS STARTED DURING THIS VISIT:  Discharge Medication List as of 04/17/2018 12:31 PM    START taking these medications   Details  methocarbamol (ROBAXIN) 500 MG tablet Take 1 tablet (500 mg total) by mouth every 8 (eight) hours as needed for muscle spasms., Starting Tue 04/17/2018, Print        Note:  This document was prepared using Dragon voice recognition software and may include unintentional dictation errors.  Alona BeneJoshua Jozee Hammer, MD Emergency Medicine    Roddrick Sharron, Arlyss RepressJoshua G, MD 04/17/18 2107

## 2018-04-17 NOTE — Discharge Instructions (Signed)

## 2018-07-01 IMAGING — DX DG FOOT COMPLETE 3+V*L*
3 series · 3 of 3 positions shown · non-contrast
Comparison: None.

CLINICAL DATA: Patient with ankle injury.  Initial encounter.

EXAM:
LEFT FOOT - COMPLETE 3+ VIEW

[foot ap]
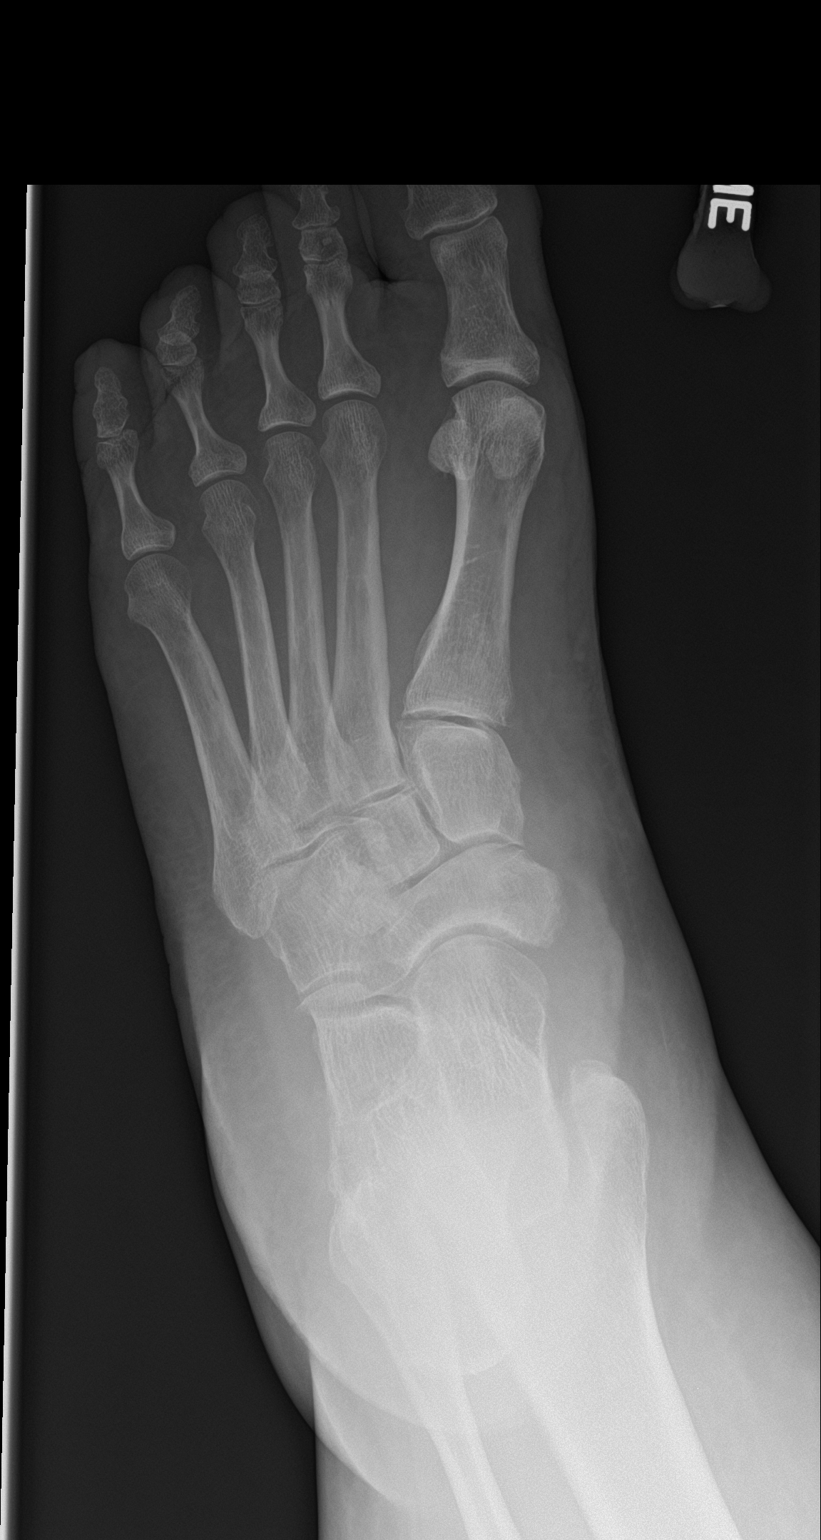

[foot obl]
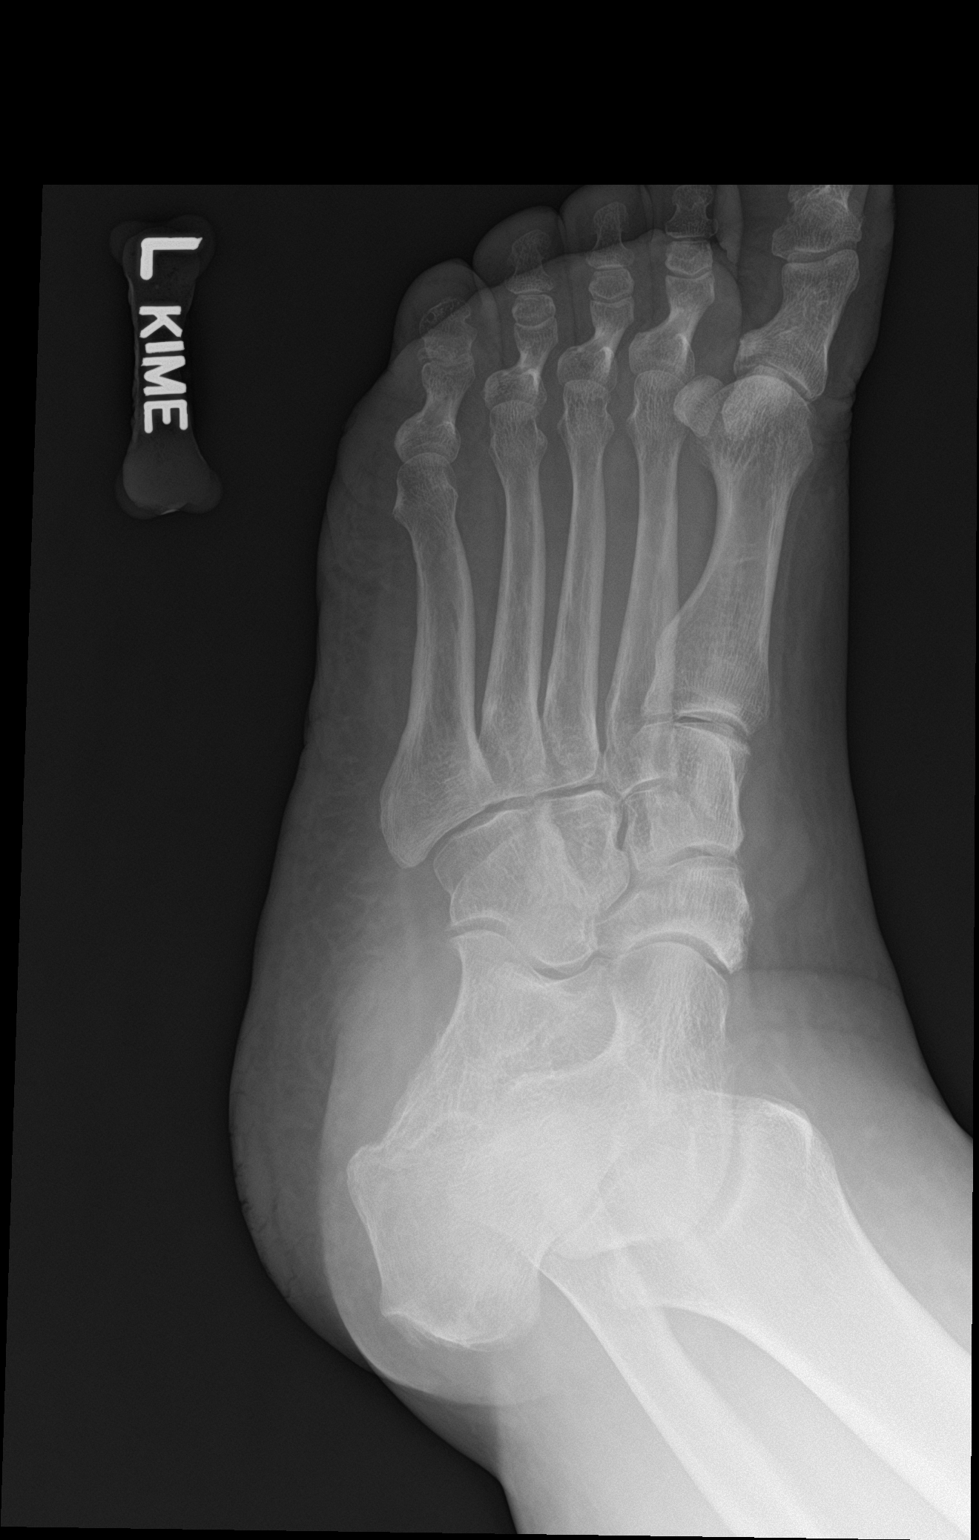

[foot lat]
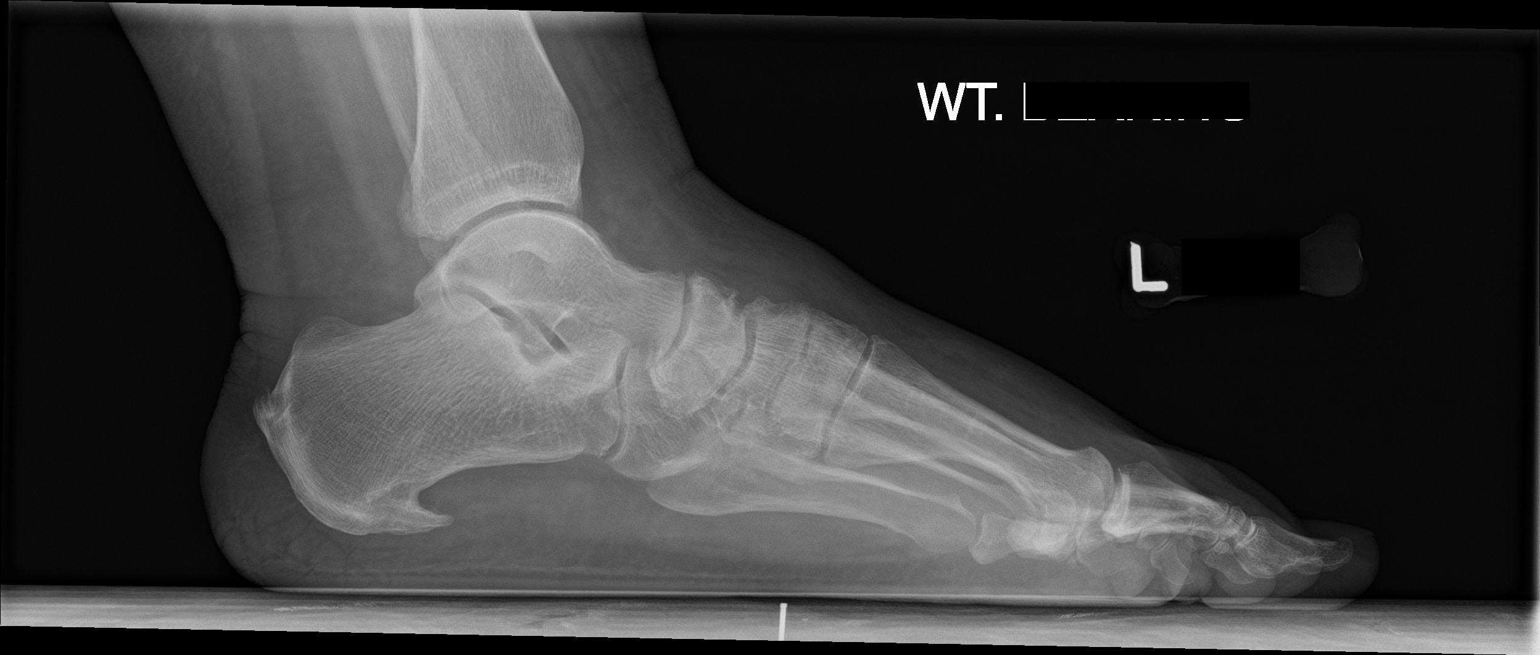

[3 of 3 positions shown; findings below may reference images not displayed]

FINDINGS: Normal anatomic alignment. No evidence for acute fracture or
dislocation. Midfoot degenerative changes. Plantar and posterior
calcaneal spurring.
IMPRESSION: No acute osseous abnormality.

## 2018-09-14 ENCOUNTER — Emergency Department (HOSPITAL_COMMUNITY): Payer: Self-pay

## 2018-09-14 ENCOUNTER — Emergency Department (HOSPITAL_COMMUNITY)
Admission: EM | Admit: 2018-09-14 | Discharge: 2018-09-14 | Disposition: A | Discharge status: Home / Self Care | Payer: Self-pay | Attending: Emergency Medicine | Admitting: Emergency Medicine

## 2018-09-14 ENCOUNTER — Encounter (HOSPITAL_COMMUNITY): Payer: Self-pay | Admitting: *Deleted

## 2018-09-14 ENCOUNTER — Other Ambulatory Visit: Payer: Self-pay

## 2018-09-14 DIAGNOSIS — W108XXA Fall (on) (from) other stairs and steps, initial encounter: Secondary | ICD-10-CM | POA: Insufficient documentation

## 2018-09-14 DIAGNOSIS — Z79899 Other long term (current) drug therapy: Secondary | ICD-10-CM | POA: Insufficient documentation

## 2018-09-14 DIAGNOSIS — M545 Low back pain, unspecified: Secondary | ICD-10-CM

## 2018-09-14 DIAGNOSIS — W19XXXA Unspecified fall, initial encounter: Secondary | ICD-10-CM

## 2018-09-14 DIAGNOSIS — Z87891 Personal history of nicotine dependence: Secondary | ICD-10-CM | POA: Insufficient documentation

## 2018-09-14 MED ORDER — LIDOCAINE 5 % EX PTCH: 1.0000 | MEDICATED_PATCH | CUTANEOUS | 0 refills | Status: AC

## 2018-09-14 MED ORDER — IBUPROFEN 800 MG PO TABS
800.0000 mg | ORAL_TABLET | Freq: Once | ORAL | Status: AC
  Administered 2018-09-14: 800 mg via ORAL
  Filled 2018-09-14: qty 1

## 2018-09-14 MED ORDER — METHOCARBAMOL 500 MG PO TABS: 500.0000 mg | ORAL_TABLET | Freq: Three times a day (TID) | ORAL | 0 refills | Status: AC | PRN

## 2018-09-14 MED ORDER — METHOCARBAMOL 1000 MG/10ML IJ SOLN
1000.0000 mg | Freq: Once | INTRAMUSCULAR | Status: DC
  Filled 2018-09-14: qty 10

## 2018-09-14 MED ORDER — LIDOCAINE 5 % EX PTCH
1.0000 | MEDICATED_PATCH | CUTANEOUS | Status: DC
  Administered 2018-09-14: 1 via TRANSDERMAL
  Filled 2018-09-14 (×2): qty 1

## 2018-09-14 MED ORDER — METHOCARBAMOL 500 MG PO TABS
1000.0000 mg | ORAL_TABLET | Freq: Once | ORAL | Status: AC
  Administered 2018-09-14: 1000 mg via ORAL
  Filled 2018-09-14: qty 2

## 2018-09-14 NOTE — ED Triage Notes (Signed)
Pt was walking down her steps that caved in, c/o R sided back pain since falling

## 2018-09-14 NOTE — Discharge Instructions (Addendum)
Take ibuprofen 3 times a day with meals.  Do not take other anti-inflammatories at the same time (Advil, Motrin, naproxen, Aleve). You may supplement with Tylenol if you need further pain control. Use Robaxin as needed for muscle stiffness or soreness. Have caution, as this may make you tired or groggy. Do not drive or operate heavy machinery while taking this medication.  Use lidocaine patches as needed for pain. Use muscle creams (bengay, icy hot, salonpas) as needed for pain.  Follow up with your primary care doctor or orthopedics for further evaluation and management.  Return to the ER if you develop high fevers, numbness, loss of bowel or bladder control, or any new or concerning symptoms.

## 2018-09-14 NOTE — ED Provider Notes (Signed)
[facility] EMERGENCY DEPARTMENT Provider Note   CSN: [id number] Arrival date & time: 09/14/18  1911     History   Chief Complaint Chief Complaint  Patient presents with  . Back Pain  . Fall    HPI [name] is a 43 y.o. female presenting for evaluation of back pain.  Patient states she was going down her stairs when the stair gave way, and she fell on her right low back/hip.  She reports acute onset pain.  Since then, pain is been constant.  She is able to walk, but reports increased pain.  She took 800 mg of ibuprofen without improvement of symptoms.  She has not taken anything else for pain.  She denies radiation of the pain.  She denies hitting her head or loss of consciousness.  She denies neck or upper back pain.  She is not on blood thinners.  She denies loss of bowel bladder control, numbness, tingling.   HPI  History reviewed. No pertinent past medical history.  There are no active problems to display for this patient.   Past Surgical History:  Procedure Laterality Date  . CHOLECYSTECTOMY       OB History   No obstetric history on file.      Home Medications    Prior to Admission medications   Medication Sig Start Date End Date Taking? Authorizing Provider  acetaminophen (TYLENOL) 500 MG tablet Take 2 tablets (1,000 mg total) every 8 (eight) hours as needed by mouth (pain.). 07/09/17   CardamaAmadeo Garnet, MD  clindamycin (CLEOCIN) 300 MG capsule Take 1 capsule (300 mg total) by mouth 4 (four) times daily. X 7 days 06/23/17   Charlynne Pander, MD  cyclobenzaprine (FLEXERIL) 10 MG tablet Take 1 tablet (10 mg total) by mouth 2 (two) times daily as needed for muscle spasms. Patient not taking: Reported on 12/09/2014 07/08/14   Elpidio Anis, PA-C  diazepam (VALIUM) 5 MG tablet Take 1 tablet (5 mg total) by mouth every 8 (eight) hours as needed for muscle spasms. Patient not taking: Reported on 12/09/2014 03/12/14   Ward, Layla Maw, DO    HYDROcodone-acetaminophen (NORCO/VICODIN) 5-325 MG tablet Take 1-2 tablets every 6 hours as needed for severe pain 06/23/17   Charlynne Pander, MD  HYDROcodone-homatropine (HYDROMET) 5-1.5 MG/5ML syrup Take 5 mLs by mouth every 6 (six) hours as needed for cough. Patient not taking: Reported on 12/09/2014 07/03/13   Rolan Bucco, MD  ibuprofen (ADVIL,MOTRIN) 200 MG tablet Take 400 mg by mouth every 6 (six) hours as needed. For pain    [provider]  ibuprofen (ADVIL,MOTRIN) 800 MG tablet Take 1 tablet (800 mg total) by mouth every 8 (eight) hours as needed for mild pain. 03/12/14   Ward, Layla Maw, DO  lidocaine (LIDODERM) 5 % Place 1 patch onto the skin daily. Remove & Discard patch within 12 hours or as directed by MD 09/14/18   Samanvi Cuccia, PA-C  methocarbamol (ROBAXIN) 500 MG tablet Take 1 tablet (500 mg total) by mouth every 8 (eight) hours as needed for muscle spasms. 09/14/18   Finnbar Cedillos, PA-C  oxyCODONE-acetaminophen (PERCOCET/ROXICET) 5-325 MG tablet Take 1 tablet by mouth every 6 (six) hours as needed for severe pain. 11/03/15   Lawyer, Cristal Deer, PA-C  predniSONE (DELTASONE) 20 MG tablet 3 tabs po day one, then 2 po daily x 4 days Patient not taking: Reported on 12/09/2014 07/03/13   Rolan Bucco, MD  promethazine-dextromethorphan (PROMETHAZINE-DM) 6.25-15 MG/5ML syrup  Take 5 mLs by mouth 4 (four) times daily as needed for cough. 08/01/17   Georgiana ShoreMitchell, Jessica B, PA-C    Family History Family History  Problem Relation Age of Onset  . Hypertension Other   . Diabetes Other   . COPD Other     Social History Social History   Tobacco Use  . Smoking status: Former Games developermoker  . Smokeless tobacco: Never Used  Substance Use Topics  . Alcohol use: No  . Drug use: No     Allergies   Amoxicillin; Ketorolac; Kiwi extract; Other; Penicillins; and Tramadol   Review of Systems Review of Systems  Musculoskeletal: Positive for back pain.  Hematological: Does not  bruise/bleed easily.     Physical Exam Updated Vital Signs BP (!) 147/103 (BP Location: Left Arm)   Pulse 91   Temp 98.3 F (36.8 C) (Oral)   Resp 19   SpO2 99%   Physical Exam Vitals signs and nursing note reviewed.  Constitutional:      General: She is not in acute distress.    Appearance: She is well-developed.     Comments: Obese F sitting in the bed in NAD  HENT:     Head: Normocephalic and atraumatic.     Comments: No sign of head injury or trauma. Eyes:     Conjunctiva/sclera: Conjunctivae normal.     Pupils: Pupils are equal, round, and reactive to light.  Neck:     Musculoskeletal: Normal range of motion and neck supple.     Comments: No tenderness palpation of midline C-spine. Cardiovascular:     Rate and Rhythm: Normal rate and regular rhythm.  Pulmonary:     Effort: Pulmonary effort is normal. No respiratory distress.     Breath sounds: Normal breath sounds. No wheezing.  Abdominal:     General: There is no distension.     Palpations: Abdomen is soft.     Tenderness: There is no abdominal tenderness.  Musculoskeletal: Normal range of motion.        General: Tenderness present.     Comments: Tenderness to palpation of right low back musculature.  No pain over left side back.  No increased pain over midline spine.  No obvious step-offs or deformities.  No tenderness palpation of the left hip.  Pedal pulses intact bilaterally.  Sensation intact bilaterally.  No saddle paresthesia.  Patient is ambulatory with pain.  Skin:    General: Skin is warm and dry.     Capillary Refill: Capillary refill takes less than 2 seconds.  Neurological:     Mental Status: She is alert and oriented to person, place, and time.      ED Treatments / Results  Labs (all labs ordered are listed, but only abnormal results are displayed) Labs Reviewed - No data to display  EKG None  Radiology Dg Lumbar Spine Complete  Result Date: 09/14/2018 CLINICAL DATA:  Right-sided low  back pain after a fall today. EXAM: LUMBAR SPINE - COMPLETE 4+ VIEW COMPARISON:  Abdomen 08/15/2011, CT abdomen and pelvis 05/20/2011 FINDINGS: Slight anterior subluxation of L4 on L5. This is probably degenerative but is new since the previous CT of 2012. No vertebral compression deformities. Degenerative changes throughout the lumbar spine with narrowed interspaces and endplate hypertrophic changes. Prominent osteophytes at T12-L1. No focal bone lesion or bone destruction. Visualized sacrum appears intact. Surgical clips in the right upper quadrant. IMPRESSION: Degenerative changes in the lumbar spine. No acute displaced fractures identified. Slight anterior subluxation of  L4 on L5 is likely degenerative. Electronically Signed   By: Burman Nieves M.D.   On: 09/14/2018 23:15    Procedures Procedures (including critical care time)  Medications Ordered in ED Medications  lidocaine (LIDODERM) 5 % 1 patch (1 patch Transdermal Patch Applied 09/14/18 2123)  ibuprofen (ADVIL,MOTRIN) tablet 800 mg (800 mg Oral Given 09/14/18 2031)  methocarbamol (ROBAXIN) tablet 1,000 mg (1,000 mg Oral Given 09/14/18 2140)     Initial Impression / Assessment and Plan / ED Course  I have reviewed the triage vital signs and the nursing notes.  Pertinent labs & imaging results that were available during my care of the patient were reviewed by me and considered in my medical decision making (see chart for details).     Presenting for evaluation of low back pain after fall.  Physical exam reassuring, pain is reproducible over the musculature.  No neurologic deficits.  Low suspicion for fracture or dislocation.  However, patient states she is in excruciating/severe pain.  As heard after a fall, will obtain x-rays to rule out concerning bony abnormalities.  X-rays viewed interpreted by me, no fracture dislocation.  Discussed findings with patient.  Patient reports no significant change in pain with symptomatic control.   Discussed that she likely has muscular pain, this will take time to resolve.  Patient encouraged to follow-up with orthopedics and primary care.  Doubt vertebral injury, infection, spinal cord compression, myelopathy, cauda equina syndrome.  Besides the fall, no red flags for back pain.  At this time, patient appears safe for discharge.  Return precautions given.  Patient states she understands and agrees plan.   Final Clinical Impressions(s) / ED Diagnoses   Final diagnoses:  Acute right-sided low back pain without sciatica  Fall, initial encounter    ED Discharge Orders         Ordered    methocarbamol (ROBAXIN) 500 MG tablet  Every 8 hours PRN     09/14/18 2327    lidocaine (LIDODERM) 5 %  Every 24 hours     09/14/18 2327           Alveria Apley, PA-C 09/14/18 2339    Charlynne Pander, MD 09/16/18 1718

## 2019-08-03 ENCOUNTER — Emergency Department (HOSPITAL_BASED_OUTPATIENT_CLINIC_OR_DEPARTMENT_OTHER)
Admission: EM | Admit: 2019-08-03 | Discharge: 2019-08-03 | Disposition: A | Discharge status: Home / Self Care | Payer: Self-pay | Attending: Emergency Medicine | Admitting: Emergency Medicine

## 2019-08-03 ENCOUNTER — Encounter (HOSPITAL_BASED_OUTPATIENT_CLINIC_OR_DEPARTMENT_OTHER): Payer: Self-pay | Admitting: *Deleted

## 2019-08-03 ENCOUNTER — Other Ambulatory Visit: Payer: Self-pay

## 2019-08-03 ENCOUNTER — Emergency Department (HOSPITAL_BASED_OUTPATIENT_CLINIC_OR_DEPARTMENT_OTHER): Payer: Self-pay

## 2019-08-03 DIAGNOSIS — Z79899 Other long term (current) drug therapy: Secondary | ICD-10-CM | POA: Insufficient documentation

## 2019-08-03 DIAGNOSIS — M25512 Pain in left shoulder: Secondary | ICD-10-CM | POA: Insufficient documentation

## 2019-08-03 DIAGNOSIS — X500XXA Overexertion from strenuous movement or load, initial encounter: Secondary | ICD-10-CM | POA: Insufficient documentation

## 2019-08-03 DIAGNOSIS — Z87891 Personal history of nicotine dependence: Secondary | ICD-10-CM | POA: Insufficient documentation

## 2019-08-03 MED ORDER — HYDROCODONE-ACETAMINOPHEN 5-325 MG PO TABS: 1.0000 | ORAL_TABLET | Freq: Four times a day (QID) | ORAL | 0 refills | Status: AC | PRN

## 2019-08-03 MED ORDER — HYDROCODONE-ACETAMINOPHEN 5-325 MG PO TABS
2.0000 | ORAL_TABLET | Freq: Once | ORAL | Status: AC
  Administered 2019-08-03: 2 via ORAL
  Filled 2019-08-03: qty 2

## 2019-08-03 NOTE — ED Provider Notes (Signed)
[facility] EMERGENCY DEPARTMENT Provider Note   CSN: [id number] Arrival date & time: 08/03/19  1637     History   Chief Complaint Chief Complaint  Patient presents with   Shoulder Pain    HPI [name] is a 43 y.o. female who presents with left shoulder pain after trying to move furniture yesterday.  She reports she felt like something popped or tore.  She is unable to lift it today.  She denies any chest pain, fevers, neck or back pain.  She has had a little bit of tingling in her left hand.  She has tried ibuprofen, Tylenol, ice at home without relief.     HPI  History reviewed. No pertinent past medical history.  There are no active problems to display for this patient.   Past Surgical History:  Procedure Laterality Date   CHOLECYSTECTOMY       OB History   No obstetric history on file.      Home Medications    Prior to Admission medications   Medication Sig Start Date End Date Taking? Authorizing Provider  acetaminophen (TYLENOL) 500 MG tablet Take 2 tablets (1,000 mg total) every 8 (eight) hours as needed by mouth (pain.). 07/09/17   CardamaGrayce Sessions, MD  clindamycin (CLEOCIN) 300 MG capsule Take 1 capsule (300 mg total) by mouth 4 (four) times daily. X 7 days 06/23/17   Drenda Freeze, MD  cyclobenzaprine (FLEXERIL) 10 MG tablet Take 1 tablet (10 mg total) by mouth 2 (two) times daily as needed for muscle spasms. Patient not taking: Reported on 12/09/2014 07/08/14   Charlann Lange, PA-C  diazepam (VALIUM) 5 MG tablet Take 1 tablet (5 mg total) by mouth every 8 (eight) hours as needed for muscle spasms. Patient not taking: Reported on 12/09/2014 03/12/14   Ward, Delice Bison, DO  HYDROcodone-acetaminophen (NORCO/VICODIN) 5-325 MG tablet Take 1-2 tablets by mouth every 6 (six) hours as needed for severe pain. 08/03/19   Dalynn Jhaveri, Bea Graff, PA-C  ibuprofen (ADVIL,MOTRIN) 200 MG tablet Take 400 mg by mouth every 6 (six) hours as needed. For pain     [provider]  ibuprofen (ADVIL,MOTRIN) 800 MG tablet Take 1 tablet (800 mg total) by mouth every 8 (eight) hours as needed for mild pain. 03/12/14   Ward, Delice Bison, DO  lidocaine (LIDODERM) 5 % Place 1 patch onto the skin daily. Remove & Discard patch within 12 hours or as directed by MD 09/14/18   Caccavale, Sophia, PA-C  methocarbamol (ROBAXIN) 500 MG tablet Take 1 tablet (500 mg total) by mouth every 8 (eight) hours as needed for muscle spasms. 09/14/18   Caccavale, Sophia, PA-C  oxyCODONE-acetaminophen (PERCOCET/ROXICET) 5-325 MG tablet Take 1 tablet by mouth every 6 (six) hours as needed for severe pain. 11/03/15   Lawyer, Harrell Gave, PA-C  predniSONE (DELTASONE) 20 MG tablet 3 tabs po day one, then 2 po daily x 4 days Patient not taking: Reported on 12/09/2014 07/03/13   Malvin Johns, MD  promethazine-dextromethorphan (PROMETHAZINE-DM) 6.25-15 MG/5ML syrup Take 5 mLs by mouth 4 (four) times daily as needed for cough. 08/01/17   Emeline General, PA-C    Family History Family History  Problem Relation Age of Onset   Hypertension Other    Diabetes Other    COPD Other     Social History Social History   Tobacco Use   Smoking status: Former Smoker   Smokeless tobacco: Never Used  Substance Use Topics   Alcohol use:  No   Drug use: No     Allergies   Amoxicillin, Ketorolac, Kiwi extract, Other, Penicillins, and Tramadol   Review of Systems Review of Systems  Constitutional: Negative for fever.  Musculoskeletal: Positive for arthralgias.  Neurological: Positive for numbness (paresthesia).     Physical Exam Updated Vital Signs BP 132/68 (BP Location: Right Arm)    Pulse 87    Temp 99.3 F (37.4 C) (Oral)    Resp 18    Ht 5' 1.5" (1.562 m)    Wt 136.1 kg    LMP 07/07/2019    SpO2 98%    BMI 55.77 kg/m   Physical Exam Vitals signs and nursing note reviewed.  Constitutional:      General: She is not in acute distress.    Appearance: She is  well-developed. She is obese. She is not diaphoretic.  HENT:     Head: Normocephalic and atraumatic.     Mouth/Throat:     Pharynx: No oropharyngeal exudate.  Eyes:     General: No scleral icterus.       Right eye: No discharge.        Left eye: No discharge.     Conjunctiva/sclera: Conjunctivae normal.     Pupils: Pupils are equal, round, and reactive to light.  Neck:     Musculoskeletal: Normal range of motion and neck supple.     Thyroid: No thyromegaly.  Cardiovascular:     Rate and Rhythm: Normal rate and regular rhythm.     Heart sounds: Normal heart sounds. No murmur. No friction rub. No gallop.   Pulmonary:     Effort: Pulmonary effort is normal. No respiratory distress.     Breath sounds: Normal breath sounds. No stridor. No wheezing or rales.  Abdominal:     General: Bowel sounds are normal. There is no distension.     Palpations: Abdomen is soft.     Tenderness: There is no abdominal tenderness. There is no guarding or rebound.  Musculoskeletal:     Comments: Significant anterior and posterior left shoulder joint tenderness; very limited range of motion with abduction, external rotation, flexion; no positive empty can test; radial pulse intact, sensation intact, grip strength decreased due to pain  Lymphadenopathy:     Cervical: No cervical adenopathy.  Skin:    General: Skin is warm and dry.     Coloration: Skin is not pale.     Findings: No rash.  Neurological:     Mental Status: She is alert.     Coordination: Coordination normal.      ED Treatments / Results  Labs (all labs ordered are listed, but only abnormal results are displayed) Labs Reviewed - No data to display  EKG None  Radiology Dg Shoulder Left  Result Date: 08/03/2019 CLINICAL DATA:  Acute onset left shoulder pain and decreased range of motion moving furniture yesterday. EXAM: LEFT SHOULDER - 2+ VIEW COMPARISON:  None. FINDINGS: There is no evidence of fracture or dislocation. Mild to  moderate acromioclavicular osteoarthritis is noted. Soft tissues are unremarkable. IMPRESSION: No acute abnormality. 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 Electronically Signed   By: Drusilla Kannerhomas  Dalessio M.D.   On: 08/03/2019 17:30    Procedures Procedures (including critical care time)  Medications Ordered in ED Medications  HYDROcodone-acetaminophen (NORCO/VICODIN) 5-325 MG per tablet 2 tablet (has no administration in time range)     Initial Impression / Assessment and Plan / ED Course  I have reviewed the triage vital signs and the nursing  notes.  Pertinent labs & imaging results that were available during my care of the patient were reviewed by me and considered in my medical decision making (see chart for details).        Patient presenting with left shoulder injury after trying to move furniture.  X-rays negative for acute abnormality, but does show mild to moderate AC osteoarthritis.  Suspect patient injured her rotator cuff.  Ice, rest discussed.  Will give arm sling, but advised to try range of motion exercises 1-2 times daily to prevent stiffness.  Continue ibuprofen and will give short course of Vicodin for breakthrough pain.  Follow-up to sports medicine for further evaluation and treatment.  Return precautions discussed.  Patient understands and agrees with plan.  Patient vitals stable throughout ED course and discharged in satisfactory condition.  Final Clinical Impressions(s) / ED Diagnoses   Final diagnoses:  Acute pain of left shoulder    ED Discharge Orders         Ordered    HYDROcodone-acetaminophen (NORCO/VICODIN) 5-325 MG tablet  Every 6 hours PRN     08/03/19 1754           Emi Holes, PA-C 08/03/19 1759    Rolan Bucco, MD 08/03/19 [phone]

## 2019-08-03 NOTE — ED Triage Notes (Signed)
States she was moving furniture yesterday by herself and she felt something rip in her left shoulder. She states she can't fully lift it now. Ambulated to triage with steady gait

## 2019-08-03 NOTE — Discharge Instructions (Signed)
Take 600 mg ibuprofen every 6 hours.  For severe, breakthrough pain take 1-2 Vicodin every 6 hours.  Use ice 3-4 times daily alternating 20 minutes on, 20 minutes off.  Use arm sling for comfort, but make sure to attempt range of motion exercises 1-2 times daily.  Please follow-up with Dr. Raeford Razor for further evaluation and treatment of your shoulder pain.  Please return to the emergency department if you develop any new or worsening symptoms.  Do not drink alcohol, drive, operate machinery or participate in any other potentially dangerous activities while taking opiate pain medication as it may make you sleepy. Do not take this medication with any other sedating medications, either prescription or over-the-counter. If you were prescribed Percocet or Vicodin, do not take these with acetaminophen (Tylenol) as it is already contained within these medications and overdose of Tylenol is dangerous.   This medication is an opiate (or narcotic) pain medication and can be habit forming.  Use it as little as possible to achieve adequate pain control.  Do not use or use it with extreme caution if you have a history of opiate abuse or dependence. This medication is intended for your use only - do not give any to anyone else and keep it in a secure place where nobody else, especially children, have access to it. It will also cause or worsen constipation, so you may want to consider taking an over-the-counter stool softener while you are taking this medication.

## 2019-08-03 NOTE — ED Notes (Signed)
Pt verbalized understanding of dc instructions.

## 2020-02-06 ENCOUNTER — Encounter (HOSPITAL_BASED_OUTPATIENT_CLINIC_OR_DEPARTMENT_OTHER): Payer: Self-pay

## 2020-02-06 ENCOUNTER — Other Ambulatory Visit: Payer: Self-pay

## 2020-02-06 ENCOUNTER — Emergency Department (HOSPITAL_BASED_OUTPATIENT_CLINIC_OR_DEPARTMENT_OTHER)
Admission: EM | Admit: 2020-02-06 | Discharge: 2020-02-06 | Disposition: A | Discharge status: Home / Self Care | Payer: Self-pay | Attending: Emergency Medicine | Admitting: Emergency Medicine

## 2020-02-06 DIAGNOSIS — Z880 Allergy status to penicillin: Secondary | ICD-10-CM | POA: Insufficient documentation

## 2020-02-06 DIAGNOSIS — Z87891 Personal history of nicotine dependence: Secondary | ICD-10-CM | POA: Insufficient documentation

## 2020-02-06 DIAGNOSIS — K047 Periapical abscess without sinus: Secondary | ICD-10-CM | POA: Insufficient documentation

## 2020-02-06 DIAGNOSIS — R220 Localized swelling, mass and lump, head: Secondary | ICD-10-CM | POA: Insufficient documentation

## 2020-02-06 MED ORDER — CLINDAMYCIN HCL 300 MG PO CAPS: 300.0000 mg | ORAL_CAPSULE | Freq: Three times a day (TID) | ORAL | 0 refills | Status: AC

## 2020-02-06 MED ORDER — CHLORHEXIDINE GLUCONATE 0.12 % MT SOLN: 15.0000 mL | Freq: Two times a day (BID) | OROMUCOSAL | 0 refills | Status: AC

## 2020-02-06 MED ORDER — CHLORHEXIDINE GLUCONATE 0.12 % MT SOLN: 15.0000 mL | Freq: Two times a day (BID) | OROMUCOSAL | 0 refills | Status: DC

## 2020-02-06 NOTE — ED Triage Notes (Signed)
Pt reports broken right lower tooth 2 days ago with swelling to right jaw-reports long hx of poor dental hygiene-NAD-steady gait

## 2020-02-06 NOTE — Discharge Instructions (Signed)
Please make sure to follow-up with the dentist provided.  Please take the full course of antibiotics as prescribed until finished.  It is important to finish the course of antibiotics to prevent the spread of infection.  Please use the chlorhexidine mouthwash twice a day.  Return to the ER if you have worsening drooling, difficulty swallowing, breathing, fevers.

## 2020-02-06 NOTE — ED Provider Notes (Addendum)
[facility] EMERGENCY DEPARTMENT Provider Note   CSN: [id number] Arrival date & time: 02/06/20  1548     History Chief Complaint  Patient presents with  . Dental Injury    [name] is a 44 y.o. female.  HPI 44 year old female with a history of poor dentition presents to the ER for right-sided dental pain.  Patient stated that several days ago, she had a tooth break off secondary to dental caries.  Since then she has had severe pain, and swelling to her right cheek.  Endorses intermittent chills.  She denies any tongue swelling, drooling, fevers, difficulty swallowing, shortness of breath, headaches, no voice change.  She does state that she has not been able to drink much but this is secondary to pain caused by the cold water.  Patient refers that she has poor dentition at baseline, and that she has been trying to find a dentist that is free and [place], but she does not qualify since she lives in [place].  She states she has been searching for another dentist.    History reviewed. No pertinent past medical history.  There are no problems to display for this patient.   Past Surgical History:  Procedure Laterality Date  . CHOLECYSTECTOMY       OB History   No obstetric history on file.     Family History  Problem Relation Age of Onset  . Hypertension Other   . Diabetes Other   . COPD Other     Social History   Tobacco Use  . Smoking status: Former Games developer  . Smokeless tobacco: Never Used  Substance Use Topics  . Alcohol use: No  . Drug use: No    Home Medications Prior to Admission medications   Medication Sig Start Date End Date Taking? Authorizing Provider  acetaminophen (TYLENOL) 500 MG tablet Take 2 tablets (1,000 mg total) every 8 (eight) hours as needed by mouth (pain.). 07/09/17   CardamaAmadeo Garnet, MD  chlorhexidine (PERIDEX) 0.12 % solution Use as directed 15 mLs in the mouth or throat 2 (two) times daily. 02/06/20    Mare Ferrari, PA-C  clindamycin (CLEOCIN) 300 MG capsule Take 1 capsule (300 mg total) by mouth 3 (three) times daily for 7 days. 02/06/20 02/13/20  Mare Ferrari, PA-C  cyclobenzaprine (FLEXERIL) 10 MG tablet Take 1 tablet (10 mg total) by mouth 2 (two) times daily as needed for muscle spasms. Patient not taking: Reported on 12/09/2014 07/08/14   Elpidio Anis, PA-C  diazepam (VALIUM) 5 MG tablet Take 1 tablet (5 mg total) by mouth every 8 (eight) hours as needed for muscle spasms. Patient not taking: Reported on 12/09/2014 03/12/14   Ward, Layla Maw, DO  HYDROcodone-acetaminophen (NORCO/VICODIN) 5-325 MG tablet Take 1-2 tablets by mouth every 6 (six) hours as needed for severe pain. 08/03/19   Law, Waylan Boga, PA-C  ibuprofen (ADVIL,MOTRIN) 200 MG tablet Take 400 mg by mouth every 6 (six) hours as needed. For pain    [provider]  ibuprofen (ADVIL,MOTRIN) 800 MG tablet Take 1 tablet (800 mg total) by mouth every 8 (eight) hours as needed for mild pain. 03/12/14   Ward, Layla Maw, DO  lidocaine (LIDODERM) 5 % Place 1 patch onto the skin daily. Remove & Discard patch within 12 hours or as directed by MD 09/14/18   Caccavale, Sophia, PA-C  methocarbamol (ROBAXIN) 500 MG tablet Take 1 tablet (500 mg total) by mouth every 8 (eight) hours as needed  for muscle spasms. 09/14/18   Caccavale, Sophia, PA-C  oxyCODONE-acetaminophen (PERCOCET/ROXICET) 5-325 MG tablet Take 1 tablet by mouth every 6 (six) hours as needed for severe pain. 11/03/15   Lawyer, Harrell Gave, PA-C  predniSONE (DELTASONE) 20 MG tablet 3 tabs po day one, then 2 po daily x 4 days Patient not taking: Reported on 12/09/2014 07/03/13   Malvin Johns, MD  promethazine-dextromethorphan (PROMETHAZINE-DM) 6.25-15 MG/5ML syrup Take 5 mLs by mouth 4 (four) times daily as needed for cough. 08/01/17   Avie Echevaria B, PA-C    Allergies    Amoxicillin, Ketorolac, Kiwi extract, Other, Penicillins, and Tramadol  Review of Systems   Review of  Systems  Constitutional: Positive for chills.  HENT: Positive for dental problem and facial swelling. Negative for drooling, ear pain, sinus pain, sore throat, trouble swallowing and voice change.   Respiratory: Negative for shortness of breath.   Cardiovascular: Negative for chest pain.  Gastrointestinal: Negative for abdominal pain, nausea and vomiting.  Neurological: Negative for headaches.    Physical Exam Updated Vital Signs BP 122/76 (BP Location: Right Arm)   Pulse 84   Temp 98.1 F (36.7 C) (Oral)   Resp 20   Ht 5\' 1"  (1.549 m)   Wt (!) 151.5 kg   LMP 02/04/2020   SpO2 99%   BMI 63.11 kg/m   Physical Exam Vitals and nursing note reviewed.  Constitutional:      General: She is not in acute distress.    Appearance: Normal appearance. She is well-developed.  HENT:     Head: Normocephalic and atraumatic.     Mouth/Throat:     Mouth: Mucous membranes are moist.     Comments: Right cheek with significant swelling.  Right lower molar with evidence of breakage.  Overall poor dentition, multiple dental caries.  No evidence of swelling to the gumline.  Mild pain and fluctuance to the right cheek.  No sublingual/submandibular swelling.  No parotid tenderness.  Uvula midline, no evidence of abscess, no unilateral swelling, tolerating secretions well.  Full range of motion of neck.   Eyes:     Extraocular Movements: Extraocular movements intact.     Conjunctiva/sclera: Conjunctivae normal.     Pupils: Pupils are equal, round, and reactive to light.  Cardiovascular:     Rate and Rhythm: Normal rate and regular rhythm.     Heart sounds: No murmur.  Pulmonary:     Effort: Pulmonary effort is normal. No respiratory distress.     Breath sounds: Normal breath sounds.  Abdominal:     Palpations: Abdomen is soft.     Tenderness: There is no abdominal tenderness.  Musculoskeletal:        General: Swelling (To right cheek) present.     Cervical back: Normal range of motion and neck  supple.  Skin:    General: Skin is warm and dry.     Findings: No erythema or rash.  Neurological:     General: No focal deficit present.     Mental Status: She is alert and oriented to person, place, and time.     ED Results / Procedures / Treatments   Labs (all labs ordered are listed, but only abnormal results are displayed) Labs Reviewed - No data to display  EKG None  Radiology No results found.  Procedures Procedures (including critical care time)  Medications Ordered in ED Medications - No data to display  ED Course  I have reviewed the triage vital signs and the nursing notes.  Pertinent labs & imaging results that were available during my care of the patient were reviewed by me and considered in my medical decision making (see chart for details).    MDM Rules/Calculators/A&P                     Patient with toothache and evidence of mild gross abscess.  She does have significant swelling to her right cheek, but little fluctuance, do not think there is an indication to drain at this time.  Exam unconcerning for Ludwig's angina or spread of infection.  Nontoxic-appearing, tolerating secretions well.  Uvula midline, without unilateral swelling.  Will treat with clindamycin has a anaphylactic reaction to penicillin per chart review.  Also provided chlorhexidine mouthwash.  Encouraged her to take over-the-counter anti-inflammatories for pain and warm compresses.  Referral to dentist provided.  Strict return precautions given, which included worsening fever, pain, drooling, shortness of breath, difficulty swallowing.  All the patient's questions have been answered to her satisfaction, she voices understanding and is agreeable to the plan.    At this stage in the ED course, the patient has been medically screened and stable for discharge.  Final Clinical Impression(s) / ED Diagnoses Final diagnoses:  Dental abscess    Rx / DC Orders ED Discharge Orders         Ordered     clindamycin (CLEOCIN) 300 MG capsule  3 times daily     02/06/20 1808    chlorhexidine (PERIDEX) 0.12 % solution  2 times daily,   Status:  Discontinued     02/06/20 1808    chlorhexidine (PERIDEX) 0.12 % solution  2 times daily     02/06/20 1809               Leone Brand 02/06/20 1907    Rolan Bucco, MD 02/07/20 [phone]

## 2020-11-02 ENCOUNTER — Emergency Department (HOSPITAL_BASED_OUTPATIENT_CLINIC_OR_DEPARTMENT_OTHER)
Admission: EM | Admit: 2020-11-02 | Discharge: 2020-11-02 | Disposition: A | Discharge status: Home / Self Care | Payer: 59 | Attending: Emergency Medicine | Admitting: Emergency Medicine

## 2020-11-02 ENCOUNTER — Emergency Department (HOSPITAL_BASED_OUTPATIENT_CLINIC_OR_DEPARTMENT_OTHER): Payer: 59

## 2020-11-02 ENCOUNTER — Other Ambulatory Visit: Payer: Self-pay

## 2020-11-02 DIAGNOSIS — N23 Unspecified renal colic: Secondary | ICD-10-CM

## 2020-11-02 DIAGNOSIS — R109 Unspecified abdominal pain: Secondary | ICD-10-CM

## 2020-11-02 DIAGNOSIS — Z87891 Personal history of nicotine dependence: Secondary | ICD-10-CM | POA: Insufficient documentation

## 2020-11-02 DIAGNOSIS — R10A0 Flank pain, unspecified side: Secondary | ICD-10-CM

## 2020-11-02 LAB — LIPASE, BLOOD: Lipase: 31 U/L (ref 11–51)

## 2020-11-02 LAB — CBC WITH DIFFERENTIAL/PLATELET
Abs Immature Granulocytes: 0.04 10*3/uL (ref 0.00–0.07)
Basophils Absolute: 0.1 10*3/uL (ref 0.0–0.1)
Basophils Relative: 0 %
Eosinophils Absolute: 0.1 10*3/uL (ref 0.0–0.5)
Eosinophils Relative: 1 %
HCT: 48.1 % — ABNORMAL HIGH (ref 36.0–46.0)
Hemoglobin: 15.8 g/dL — ABNORMAL HIGH (ref 12.0–15.0)
Immature Granulocytes: 0 %
Lymphocytes Relative: 25 %
Lymphs Abs: 2.9 10*3/uL (ref 0.7–4.0)
MCH: 30.7 pg (ref 26.0–34.0)
MCHC: 32.8 g/dL (ref 30.0–36.0)
MCV: 93.6 fL (ref 80.0–100.0)
Monocytes Absolute: 0.6 10*3/uL (ref 0.1–1.0)
Monocytes Relative: 5 %
Neutro Abs: 8 10*3/uL — ABNORMAL HIGH (ref 1.7–7.7)
Neutrophils Relative %: 69 %
Platelets: 441 10*3/uL — ABNORMAL HIGH (ref 150–400)
RBC: 5.14 MIL/uL — ABNORMAL HIGH (ref 3.87–5.11)
RDW: 16.5 % — ABNORMAL HIGH (ref 11.5–15.5)
WBC: 11.7 10*3/uL — ABNORMAL HIGH (ref 4.0–10.5)
nRBC: 0 % (ref 0.0–0.2)

## 2020-11-02 LAB — URINALYSIS, ROUTINE W REFLEX MICROSCOPIC
Bilirubin Urine: NEGATIVE
Glucose, UA: NEGATIVE mg/dL
Ketones, ur: NEGATIVE mg/dL
Leukocytes,Ua: NEGATIVE
Nitrite: NEGATIVE
Protein, ur: NEGATIVE mg/dL
Specific Gravity, Urine: 1.005 (ref 1.005–1.030)
pH: 6 (ref 5.0–8.0)

## 2020-11-02 LAB — URINALYSIS, MICROSCOPIC (REFLEX): WBC, UA: NONE SEEN WBC/hpf (ref 0–5)

## 2020-11-02 LAB — COMPREHENSIVE METABOLIC PANEL
ALT: 27 U/L (ref 0–44)
AST: 25 U/L (ref 15–41)
Albumin: 4.2 g/dL (ref 3.5–5.0)
Alkaline Phosphatase: 102 U/L (ref 38–126)
Anion gap: 11 (ref 5–15)
BUN: 10 mg/dL (ref 6–20)
CO2: 26 mmol/L (ref 22–32)
Calcium: 9.4 mg/dL (ref 8.9–10.3)
Chloride: 98 mmol/L (ref 98–111)
Creatinine, Ser: 0.69 mg/dL (ref 0.44–1.00)
GFR, Estimated: >60 mL/min (ref >60)
Glucose, Bld: 113 mg/dL — ABNORMAL HIGH (ref 70–99)
Potassium: 3.7 mmol/L (ref 3.5–5.1)
Sodium: 135 mmol/L (ref 135–145)
Total Bilirubin: 0.3 mg/dL (ref 0.3–1.2)
Total Protein: 8.9 g/dL — ABNORMAL HIGH (ref 6.5–8.1)

## 2020-11-02 LAB — PREGNANCY, URINE: Preg Test, Ur: NEGATIVE

## 2020-11-02 MED ORDER — OXYCODONE-ACETAMINOPHEN 5-325 MG PO TABS: 1.0000 | ORAL_TABLET | ORAL | 0 refills | Status: AC | PRN

## 2020-11-02 MED ORDER — SODIUM CHLORIDE 0.9 % IV BOLUS
1000.0000 mL | Freq: Once | INTRAVENOUS | Status: DC
  Administered 2020-11-02: 1000 mL via INTRAVENOUS

## 2020-11-02 MED ORDER — HYDROMORPHONE HCL 1 MG/ML IJ SOLN
0.5000 mg | Freq: Once | INTRAMUSCULAR | Status: AC
  Administered 2020-11-02: 0.5 mg via INTRAVENOUS
  Filled 2020-11-02: qty 1

## 2020-11-02 MED ORDER — ONDANSETRON 4 MG PO TBDP: 4.0000 mg | ORAL_TABLET | Freq: Three times a day (TID) | ORAL | 0 refills | Status: AC | PRN

## 2020-11-02 MED ORDER — ONDANSETRON HCL 4 MG/2ML IJ SOLN
4.0000 mg | Freq: Once | INTRAMUSCULAR | Status: AC
  Administered 2020-11-02: 4 mg via INTRAVENOUS
  Filled 2020-11-02: qty 2

## 2020-11-02 MED ORDER — SODIUM CHLORIDE 0.9 % IV SOLN: Freq: Once | INTRAVENOUS | Status: AC

## 2020-11-02 NOTE — ED Provider Notes (Signed)
[facility] EMERGENCY DEPARTMENT Provider Note   CSN: [id number] Arrival date & time: 11/02/20  1728     History Chief Complaint  Patient presents with  . Flank Pain    AOLANIS Nicholson is a 45 y.o. female.  HPI  Patient is a 45 year old female with a past medical history of severe obesity and is s/p cholecystectomy.  She is presenting to the ER today for severe right-sided flank pain that radiates into her abdomen she states that it began on Saturday and has been consistent since.  She states that she has not had any urinary symptoms such as urgency, frequency, hematuria however she states that this morning she had a lot of difficulty urinating and said that she strained while peeing felt a popping sensation and then noticed some blood on the toilet when she wiped.  She states she is never a kidney stone in the past.  She denies any fevers or chills.  She does endorse some nausea and some nonbloody nonbilious emesis she states fewer than 3 episodes today.  She denies any consistent abdominal pain but states that the pain does seem to intermittently radiate into her abdomen.  No other associate symptoms.  No aggravating or mitigating factors.  She states that she is seeing her primary care doctor tomorrow at 1 PM but came to the ER because she feels like her pain has been persistent.  She denies any chest pain or shortness of breath.  She is taken no medications prior to arrival    No past medical history on file.  There are no problems to display for this patient.   Past Surgical History:  Procedure Laterality Date  . CHOLECYSTECTOMY       OB History   No obstetric history on file.     Family History  Problem Relation Age of Onset  . Hypertension Other   . Diabetes Other   . COPD Other     Social History   Tobacco Use  . Smoking status: Former Games developer  . Smokeless tobacco: Never Used  Vaping Use  . Vaping Use: Never used  Substance Use Topics  .  Alcohol use: No  . Drug use: No    Home Medications Prior to Admission medications   Medication Sig Start Date End Date Taking? Authorizing Provider  ondansetron (ZOFRAN ODT) 4 MG disintegrating tablet Take 1 tablet (4 mg total) by mouth every 8 (eight) hours as needed for nausea or vomiting. 11/02/20  Yes Jeffrey Graefe S, PA  oxyCODONE-acetaminophen (PERCOCET/ROXICET) 5-325 MG tablet Take 1 tablet by mouth every 4 (four) hours as needed for severe pain. 11/02/20  Yes Sigfredo Schreier, Stevphen Meuse S, PA  acetaminophen (TYLENOL) 500 MG tablet Take 2 tablets (1,000 mg total) every 8 (eight) hours as needed by mouth (pain.). 07/09/17   CardamaAmadeo Garnet, MD  chlorhexidine (PERIDEX) 0.12 % solution Use as directed 15 mLs in the mouth or throat 2 (two) times daily. 02/06/20   Mare Ferrari, PA-C  cyclobenzaprine (FLEXERIL) 10 MG tablet Take 1 tablet (10 mg total) by mouth 2 (two) times daily as needed for muscle spasms. Patient not taking: Reported on 12/09/2014 07/08/14   Elpidio Anis, PA-C  diazepam (VALIUM) 5 MG tablet Take 1 tablet (5 mg total) by mouth every 8 (eight) hours as needed for muscle spasms. Patient not taking: Reported on 12/09/2014 03/12/14   Ward, Layla Maw, DO  HYDROcodone-acetaminophen (NORCO/VICODIN) 5-325 MG tablet Take 1-2 tablets by mouth every 6 (six) hours  as needed for severe pain. 08/03/19   Law, Waylan BogaAlexandra M, PA-C  ibuprofen (ADVIL,MOTRIN) 200 MG tablet Take 400 mg by mouth every 6 (six) hours as needed. For pain    [provider]  ibuprofen (ADVIL,MOTRIN) 800 MG tablet Take 1 tablet (800 mg total) by mouth every 8 (eight) hours as needed for mild pain. 03/12/14   Ward, Layla MawKristen N, DO  lidocaine (LIDODERM) 5 % Place 1 patch onto the skin daily. Remove & Discard patch within 12 hours or as directed by MD 09/14/18   Caccavale, Sophia, PA-C  methocarbamol (ROBAXIN) 500 MG tablet Take 1 tablet (500 mg total) by mouth every 8 (eight) hours as needed for muscle spasms. 09/14/18   Caccavale,  Sophia, PA-C  predniSONE (DELTASONE) 20 MG tablet 3 tabs po day one, then 2 po daily x 4 days Patient not taking: Reported on 12/09/2014 07/03/13   Rolan BuccoBelfi, Melanie, MD  promethazine-dextromethorphan (PROMETHAZINE-DM) 6.25-15 MG/5ML syrup Take 5 mLs by mouth 4 (four) times daily as needed for cough. 08/01/17   Georgiana ShoreMitchell, Jessica B, PA-C    Allergies    Amoxicillin, Ketorolac, Kiwi extract, Other, Penicillins, and Tramadol  Review of Systems   Review of Systems  Constitutional: Negative for chills and fever.  HENT: Negative for congestion.   Eyes: Negative for pain.  Respiratory: Negative for cough and shortness of breath.   Cardiovascular: Negative for chest pain and leg swelling.  Gastrointestinal: Positive for abdominal pain, nausea and vomiting. Negative for constipation and diarrhea.  Genitourinary: Positive for difficulty urinating. Negative for dysuria, frequency, hematuria, pelvic pain, vaginal bleeding, vaginal discharge and vaginal pain.  Musculoskeletal: Negative for myalgias.  Skin: Negative for rash.  Neurological: Negative for dizziness and headaches.    Physical Exam Updated Vital Signs BP (!) 146/95 (BP Location: Left Arm)   Pulse 94   Temp 97.6 F (36.4 C)   Resp 18   Ht 5\' 1"  (1.549 m)   Wt (!) 151.2 kg   SpO2 94%   BMI 62.98 kg/m   Physical Exam Vitals and nursing note reviewed.  Constitutional:      General: She is not in acute distress.    Comments: Morbidly obese 45 year old female  In significant discomfort. Is not acutely ill-appearing or diaphoretic  HENT:     Head: Normocephalic and atraumatic.     Nose: Nose normal.     Mouth/Throat:     Mouth: Mucous membranes are dry.  Eyes:     General: No scleral icterus. Cardiovascular:     Rate and Rhythm: Normal rate and regular rhythm.     Pulses: Normal pulses.     Heart sounds: Normal heart sounds.  Pulmonary:     Effort: Pulmonary effort is normal. No respiratory distress.     Breath sounds: No  wheezing.  Abdominal:     Palpations: Abdomen is soft.     Tenderness: There is no abdominal tenderness. There is right CVA tenderness. There is no left CVA tenderness, guarding or rebound.     Comments: Patient has no significant abdominal tenderness but has notable right CVA tenderness with no left CVA tenderness.  Musculoskeletal:     Cervical back: Normal range of motion.     Right lower leg: No edema.     Left lower leg: No edema.     Comments: No tenderness palpation of midline L, T, C-spine  Skin:    General: Skin is warm and dry.     Capillary Refill: Capillary refill takes  less than 2 seconds.  Neurological:     Mental Status: She is alert. Mental status is at baseline.  Psychiatric:        Mood and Affect: Mood normal.        Behavior: Behavior normal.     ED Results / Procedures / Treatments   Labs (all labs ordered are listed, but only abnormal results are displayed) Labs Reviewed  URINALYSIS, ROUTINE W REFLEX MICROSCOPIC - Abnormal; Notable for the following components:      Result Value   Hgb urine dipstick TRACE (*)    All other components within normal limits  URINALYSIS, MICROSCOPIC (REFLEX) - Abnormal; Notable for the following components:   Bacteria, UA RARE (*)    All other components within normal limits  CBC WITH DIFFERENTIAL/PLATELET - Abnormal; Notable for the following components:   WBC 11.7 (*)    RBC 5.14 (*)    Hemoglobin 15.8 (*)    HCT 48.1 (*)    RDW 16.5 (*)    Platelets 441 (*)    Neutro Abs 8.0 (*)    All other components within normal limits  COMPREHENSIVE METABOLIC PANEL - Abnormal; Notable for the following components:   Glucose, Bld 113 (*)    Total Protein 8.9 (*)    All other components within normal limits  PREGNANCY, URINE  LIPASE, BLOOD    EKG None  Radiology CT Renal Stone Study  Result Date: 11/02/2020 CLINICAL DATA:  Right flank pain EXAM: CT ABDOMEN AND PELVIS WITHOUT CONTRAST TECHNIQUE: Multidetector CT imaging of  the abdomen and pelvis was performed following the standard protocol without IV contrast. COMPARISON:  CT 05/20/2011 FINDINGS: Lower chest: Lung bases demonstrate no acute consolidation or effusion. Normal cardiac size. Trace pericardial effusion. Hepatobiliary: No focal liver abnormality is seen. Status post cholecystectomy. No biliary dilatation. Pancreas: Unremarkable. No pancreatic ductal dilatation or surrounding inflammatory changes. Spleen: Normal in size without focal abnormality. Adrenals/Urinary Tract: Adrenal glands are normal. Kidneys show no hydronephrosis. The bladder is normal. Stomach/Bowel: Stomach is within normal limits. Appendix appears normal. No evidence of bowel wall thickening, distention, or inflammatory changes. Vascular/Lymphatic: No significant vascular findings are present. No enlarged abdominal or pelvic lymph nodes. Reproductive: Uterus and bilateral adnexa are unremarkable. Other: Negative for free air or free fluid. Musculoskeletal: No acute or significant osseous findings. IMPRESSION: 1. Negative for hydronephrosis or ureteral stone. 2. No CT evidence for acute intra-abdominal or pelvic abnormality Electronically Signed   By: Jasmine Pang M.D.   On: 11/02/2020 20:50    Procedures Procedures   Medications Ordered in ED Medications  0.9 %  sodium chloride infusion ( Intravenous Stopped 11/02/20 1959)  ondansetron (ZOFRAN) injection 4 mg (4 mg Intravenous Given 11/02/20 1949)  HYDROmorphone (DILAUDID) injection 0.5 mg (0.5 mg Intravenous Given 11/02/20 1950)  HYDROmorphone (DILAUDID) injection 0.5 mg (0.5 mg Intravenous Given 11/02/20 2107)    ED Course  I have reviewed the triage vital signs and the nursing notes.  Pertinent labs & imaging results that were available during my care of the patient were reviewed by me and considered in my medical decision making (see chart for details).  Patient is 45 year old female presented today with right-sided flank pain since  Saturday.  She had an episode that sounds very much like she just recently passed a urinary stone and had some blood on the toilet paper when she wiped this morning although she did previous to this, and deny hematuria.  She has no vaginal symptoms or pelvic  pain. On physical examination she jumps off the bed in discomfort with CVA percussion.  No significant abdominal tenderness.  Specifically doubt AAA, thoracic aortic dissection, SBO, LBO, mesenteric ischemia or vascular cause of patient's symptoms.  She appears to be in significant discomfort however therefore will provide patient with analgesia and reassess.  Clinical Course as of 11/02/20 2157  Mon Nov 02, 2020  2152 CBC with Differential/Platelet(!) CBC shows evidence of dehydration with elevated platelets, WBC, RBC. No disproportional leukocytosis.  CMP without elevation in AST or ALT and normal bilirubin present.  No abnormalities.  Urine pregnancy test is negative.  Urinalysis shows trace hemoglobin negative for nitrates and leukocytes.  This is consistent with current or recently passed ureteral stone.  Lipase within normal doubt pancreatitis [WF]  2153 No evidence of intraabdominal pathology/stone  IMPRESSION: 1. Negative for hydronephrosis or ureteral stone. 2. No CT evidence for acute intra-abdominal or pelvic abnormality [WF]  2154 Given patient's history, physical exam, urinalysis I have very high suspicion for recently passed ureteral stone.  Will provide patient with short course of Percocet for residual pain.  She will follow-up tomorrow with her PCP.  Return precautions given.  Pain is well controlled at this time after pain medicine. [WF]    Clinical Course User Index [WF] Gailen Shelter, PA   MDM Rules/Calculators/A&P                          Final Clinical Impression(s) / ED Diagnoses Final diagnoses:  Ureteral colic  Flank pain    Rx / DC Orders ED Discharge Orders         Ordered     oxyCODONE-acetaminophen (PERCOCET/ROXICET) 5-325 MG tablet  Every 4 hours PRN        11/02/20 2141    ondansetron (ZOFRAN ODT) 4 MG disintegrating tablet  Every 8 hours PRN        11/02/20 2141           Gailen Shelter, [place] 11/02/20 2157    Jacalyn Lefevre, MD 11/02/20 2215

## 2020-11-02 NOTE — ED Triage Notes (Signed)
Pt c/o right flank pain that radiates to left side. Pt states that the pain is constant and that it started approx 2 days ago but has gotten worse since then. Pt states she is unable to keep food down but denies any urinary problems. Pt aaox3, ambulatory with steady gait, GCS 15.

## 2020-11-02 NOTE — Discharge Instructions (Addendum)
Please follow-up with your primary care doctor tomorrow as you are already scheduled. It was return to the ER for any new or concerning symptoms.  Please drink plenty of water take the medications I prescribed you.  Tonight you may take Tylenol and ibuprofen as discussed below.  Please use Tylenol or ibuprofen for pain.  You may use 600 mg ibuprofen every 6 hours or 1000 mg of Tylenol every 6 hours.  You may choose to alternate between the 2.  This would be most effective.  Not to exceed 4 g of Tylenol within 24 hours.  Not to exceed 3200 mg ibuprofen 24 hours.

## 2020-12-18 ENCOUNTER — Other Ambulatory Visit: Payer: Self-pay | Admitting: Orthopaedic Surgery

## 2020-12-18 DIAGNOSIS — M25512 Pain in left shoulder: Secondary | ICD-10-CM

## 2020-12-29 ENCOUNTER — Other Ambulatory Visit: Payer: 59

## 2020-12-30 ENCOUNTER — Ambulatory Visit
Admission: RE | Admit: 2020-12-30 | Discharge: 2020-12-30 | Disposition: A | Discharge status: Home / Self Care | Payer: 59 | Source: Ambulatory Visit | Attending: Orthopaedic Surgery | Admitting: Orthopaedic Surgery

## 2020-12-30 DIAGNOSIS — M25512 Pain in left shoulder: Secondary | ICD-10-CM
